# Patient Record
Sex: Female | Born: 1985 | Race: White | Hispanic: No | Marital: Married | Smoking: Never smoker
Health system: Southern US, Community
[De-identification: ages and names within clinical notes are randomized; demographics above are authoritative.]

## PROBLEM LIST (undated history)

## (undated) DIAGNOSIS — F32A Depression, unspecified: Secondary | ICD-10-CM

## (undated) DIAGNOSIS — N809 Endometriosis, unspecified: Secondary | ICD-10-CM

## (undated) DIAGNOSIS — F419 Anxiety disorder, unspecified: Secondary | ICD-10-CM

## (undated) DIAGNOSIS — R519 Headache, unspecified: Secondary | ICD-10-CM

## (undated) DIAGNOSIS — R51 Headache: Secondary | ICD-10-CM

## (undated) DIAGNOSIS — F329 Major depressive disorder, single episode, unspecified: Secondary | ICD-10-CM

## (undated) DIAGNOSIS — I2699 Other pulmonary embolism without acute cor pulmonale: Secondary | ICD-10-CM

## (undated) HISTORY — PX: DRUG INDUCED ENDOSCOPY: SHX6808

## (undated) HISTORY — DX: Endometriosis, unspecified: N80.9

## (undated) HISTORY — PX: OTHER SURGICAL HISTORY: SHX169

## (undated) HISTORY — PX: COLPOSCOPY: SHX161

---
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
## 2006-05-07 HISTORY — PX: TONSILLECTOMY: SUR1361

## 2015-06-23 LAB — OB RESULTS CONSOLE RUBELLA ANTIBODY, IGM: Rubella: IMMUNE

## 2015-06-23 LAB — OB RESULTS CONSOLE HIV ANTIBODY (ROUTINE TESTING): HIV: NONREACTIVE

## 2015-06-23 LAB — OB RESULTS CONSOLE RPR: RPR: NONREACTIVE

## 2015-06-23 LAB — OB RESULTS CONSOLE HEPATITIS B SURFACE ANTIGEN: Hepatitis B Surface Ag: NEGATIVE

## 2015-11-30 ENCOUNTER — Inpatient Hospital Stay (HOSPITAL_COMMUNITY): Payer: BLUE CROSS/BLUE SHIELD

## 2015-11-30 ENCOUNTER — Inpatient Hospital Stay (HOSPITAL_COMMUNITY)
Admission: AD | Admit: 2015-11-30 | Discharge: 2015-12-04 | DRG: 765 | Disposition: A | Discharge status: Home / Self Care | Payer: BLUE CROSS/BLUE SHIELD | Source: Ambulatory Visit | Attending: Obstetrics and Gynecology | Admitting: Obstetrics and Gynecology

## 2015-11-30 ENCOUNTER — Encounter: Payer: Self-pay | Admitting: Advanced Practice Midwife

## 2015-11-30 DIAGNOSIS — O4593 Premature separation of placenta, unspecified, third trimester: Secondary | ICD-10-CM | POA: Diagnosis present

## 2015-11-30 DIAGNOSIS — Z86711 Personal history of pulmonary embolism: Secondary | ICD-10-CM | POA: Diagnosis not present

## 2015-11-30 DIAGNOSIS — Z3A31 31 weeks gestation of pregnancy: Secondary | ICD-10-CM

## 2015-11-30 DIAGNOSIS — Z9889 Other specified postprocedural states: Secondary | ICD-10-CM

## 2015-11-30 DIAGNOSIS — O42013 Preterm premature rupture of membranes, onset of labor within 24 hours of rupture, third trimester: Secondary | ICD-10-CM | POA: Diagnosis present

## 2015-11-30 DIAGNOSIS — O4290 Premature rupture of membranes, unspecified as to length of time between rupture and onset of labor, unspecified weeks of gestation: Secondary | ICD-10-CM | POA: Diagnosis present

## 2015-11-30 HISTORY — DX: Other pulmonary embolism without acute cor pulmonale: I26.99

## 2015-11-30 LAB — CBC
HCT: 35.3 % — ABNORMAL LOW (ref 36.0–46.0)
Hemoglobin: 12.1 g/dL (ref 12.0–15.0)
MCH: 31.8 pg (ref 26.0–34.0)
MCHC: 34.3 g/dL (ref 30.0–36.0)
MCV: 92.9 fL (ref 78.0–100.0)
Platelets: 205 10*3/uL (ref 150–400)
RBC: 3.8 MIL/uL — ABNORMAL LOW (ref 3.87–5.11)
RDW: 13.2 % (ref 11.5–15.5)
WBC: 22.9 10*3/uL — ABNORMAL HIGH (ref 4.0–10.5)

## 2015-11-30 LAB — TYPE AND SCREEN
ABO/RH(D): B POS
Antibody Screen: NEGATIVE

## 2015-11-30 MED ORDER — DOCUSATE SODIUM 100 MG PO CAPS
100.0000 mg | ORAL_CAPSULE | Freq: Every day | ORAL | Status: DC
  Administered 2015-12-02 – 2015-12-04 (×2): 100 mg via ORAL
  Filled 2015-11-30 (×3): qty 1

## 2015-11-30 MED ORDER — ZOLPIDEM TARTRATE 5 MG PO TABS: 5.0000 mg | ORAL_TABLET | Freq: Every evening | ORAL | Status: DC | PRN

## 2015-11-30 MED ORDER — MAGNESIUM SULFATE 50 % IJ SOLN
2.0000 g/h | INTRAVENOUS | Status: DC
  Administered 2015-11-30: 2 g/h via INTRAVENOUS
  Filled 2015-11-30: qty 80

## 2015-11-30 MED ORDER — LACTATED RINGERS IV SOLN
INTRAVENOUS | Status: DC
  Administered 2015-11-30: 15:00:00 via INTRAVENOUS

## 2015-11-30 MED ORDER — MAGNESIUM SULFATE 50 % IJ SOLN
2.0000 g/h | INTRAMUSCULAR | Status: DC
  Administered 2015-12-01: 2 g/h via INTRAVENOUS
  Filled 2015-11-30 (×2): qty 80

## 2015-11-30 MED ORDER — LIDOCAINE HCL (PF) 1 % IJ SOLN: 30.0000 mL | INTRAMUSCULAR | Status: DC | PRN

## 2015-11-30 MED ORDER — SODIUM CHLORIDE 0.9 % IV SOLN
2.0000 g | Freq: Four times a day (QID) | INTRAVENOUS | Status: DC
  Administered 2015-11-30 – 2015-12-01 (×6): 2 g via INTRAVENOUS
  Filled 2015-11-30 (×8): qty 2000

## 2015-11-30 MED ORDER — SOD CITRATE-CITRIC ACID 500-334 MG/5ML PO SOLN
30.0000 mL | ORAL | Status: DC | PRN
  Administered 2015-12-01: 30 mL via ORAL
  Filled 2015-11-30: qty 15

## 2015-11-30 MED ORDER — ERYTHROMYCIN BASE 250 MG PO TABS
250.0000 mg | ORAL_TABLET | Freq: Four times a day (QID) | ORAL | Status: DC
  Filled 2015-11-30 (×2): qty 1

## 2015-11-30 MED ORDER — OXYCODONE-ACETAMINOPHEN 5-325 MG PO TABS
1.0000 | ORAL_TABLET | ORAL | Status: DC | PRN
Start: 2015-11-30 — End: 2015-12-01

## 2015-11-30 MED ORDER — LACTATED RINGERS IV SOLN: 500.0000 mL | INTRAVENOUS | Status: DC | PRN

## 2015-11-30 MED ORDER — SODIUM CHLORIDE 0.9 % IV SOLN
250.0000 mg | Freq: Four times a day (QID) | INTRAVENOUS | Status: DC
  Administered 2015-11-30 – 2015-12-01 (×5): 250 mg via INTRAVENOUS
  Filled 2015-11-30 (×9): qty 5

## 2015-11-30 MED ORDER — OXYTOCIN BOLUS FROM INFUSION: 500.0000 mL | Freq: Once | INTRAVENOUS | Status: DC

## 2015-11-30 MED ORDER — MAGNESIUM SULFATE BOLUS VIA INFUSION
4.0000 g | Freq: Once | INTRAVENOUS | Status: AC
  Administered 2015-11-30: 4 g via INTRAVENOUS
  Filled 2015-11-30: qty 500

## 2015-11-30 MED ORDER — INDOMETHACIN 50 MG PO CAPS: 100.0000 mg | ORAL_CAPSULE | Freq: Once | ORAL | Status: DC

## 2015-11-30 MED ORDER — BETAMETHASONE SOD PHOS & ACET 6 (3-3) MG/ML IJ SUSP
12.0000 mg | INTRAMUSCULAR | Status: AC
  Administered 2015-11-30 – 2015-12-01 (×2): 12 mg via INTRAMUSCULAR
  Filled 2015-11-30 (×2): qty 2

## 2015-11-30 MED ORDER — ACETAMINOPHEN 325 MG PO TABS: 650.0000 mg | ORAL_TABLET | ORAL | Status: DC | PRN

## 2015-11-30 MED ORDER — OXYCODONE-ACETAMINOPHEN 5-325 MG PO TABS: 2.0000 | ORAL_TABLET | ORAL | Status: DC | PRN

## 2015-11-30 MED ORDER — PRENATAL MULTIVITAMIN CH
1.0000 | ORAL_TABLET | Freq: Every day | ORAL | Status: DC
  Administered 2015-12-03: 1 via ORAL
  Filled 2015-11-30 (×3): qty 1

## 2015-11-30 MED ORDER — CALCIUM CARBONATE ANTACID 500 MG PO CHEW: 2.0000 | CHEWABLE_TABLET | ORAL | Status: DC | PRN

## 2015-11-30 MED ORDER — AMOXICILLIN 500 MG PO CAPS
500.0000 mg | ORAL_CAPSULE | Freq: Three times a day (TID) | ORAL | Status: DC
  Filled 2015-11-30 (×2): qty 1

## 2015-11-30 MED ORDER — ONDANSETRON HCL 4 MG/2ML IJ SOLN: 4.0000 mg | Freq: Four times a day (QID) | INTRAMUSCULAR | Status: DC | PRN

## 2015-11-30 MED ORDER — OXYTOCIN 40 UNITS IN LACTATED RINGERS INFUSION - SIMPLE MED: 2.5000 [IU]/h | INTRAVENOUS | Status: DC

## 2015-11-30 MED ORDER — INDOMETHACIN 25 MG PO CAPS: 25.0000 mg | ORAL_CAPSULE | Freq: Four times a day (QID) | ORAL | Status: DC

## 2015-11-30 MED ORDER — LACTATED RINGERS IV SOLN
INTRAVENOUS | Status: DC
  Administered 2015-12-01 (×2): via INTRAVENOUS

## 2015-11-30 NOTE — H&P (Signed)
30 y.o. [redacted]w[redacted]d  G1P0 comes in c/o ROM at 2am.  Otherwise has good fetal movement and no bleeding.  She did not report contractions.  History reviewed. No pertinent past medical history. History reviewed. No pertinent surgical history.  OB History  Gravida Para Term Preterm AB Living  1            SAB TAB Ectopic Multiple Live Births               # Outcome Date GA Lbr Len/2nd Weight Sex Delivery Anes PTL Lv  1 Current               Social History   Social History  . Marital status: Married    Spouse name: N/A  . Number of children: N/A  . Years of education: N/A   Occupational History  . Not on file.   Social History Main Topics  . Smoking status: Never Smoker  . Smokeless tobacco: Never Used  . Alcohol use Not on file  . Drug use: Unknown  . Sexual activity: Yes   Other Topics Concern  . Not on file   Social History Narrative  . No narrative on file   Naproxen    Prenatal Transfer Tool  Maternal Diabetes: No Genetic Screening: Declined Maternal Ultrasounds/Referrals: Normal Fetal Ultrasounds or other Referrals:  None Maternal Substance Abuse:  No Significant Maternal Medications:  None Significant Maternal Lab Results: None  Other PNC: uncomplicated.  H/o PE on OCPs 2006.  Started on prophylactic Lovenox early pregnancy.    Vitals:   11/30/15 2043 11/30/15 2100  BP:    Pulse:    Resp: (P) 18 18  Temp:  98.2 F (36.8 C)     Lungs/Cor:  NAD Abdomen:  soft, gravid Ex:  no cords, erythema SVE:  4/100/+1 at initial check, now 7-8cm FHTs:  140, good STV, NST R Toco:  q3-19   A/P   Admitted with PPROM at 31.2 Dr. Dareen Piano started pt on latency antibiotics and course of betamethasone. [place] cephalic. She became more uncomfortable in the morning and was found to be fully effaced and 4cm.  Pt reports anaphylactic symptoms to naproxen, so indomethicin was not started.  MgSO4 initiated for CP prophylaxis.  Lovenox held and SCDs placed. Later in the day pt was  feeling more pain and was rechecked and found to be 6cm.  She was transferred to L&D.  Mgmt above continues.  Discussed with RN limiting cervical checks as possible and being vigilant for signs of chorioamnionitis.  GBS unknown  Lakya Schrupp

## 2015-11-30 NOTE — MAU Note (Signed)
Pt presents complaining of SROM with copious amounts of clear fluid. Denies pain. Reports good fetal movement. Denies bleeding

## 2015-11-30 NOTE — Anesthesia Pain Management Evaluation Note (Signed)
  CRNA Pain Management Visit Note  Patient: Stephanie Hicks, 30 y.o., female  "Hello I am a member of the anesthesia team at [facility]. We have an anesthesia team available at all times to provide care throughout the hospital, including epidural management and anesthesia for C-section. I don't know your plan for the delivery whether it a natural birth, water birth, IV sedation, nitrous supplementation, doula or epidural, but we want to meet your pain goals."   1.Was your pain managed to your expectations on prior hospitalizations?   No prior hospitalizations  2.What is your expectation for pain management during this hospitalization?     IV pain meds and Nitrous Oxide  3.How can we help you reach that goal? Be available if needed.    Record the patient's initial score and the patient's pain goal.   Pain: 8  Pain Goal: 9 [facility] wants you to be able to say your pain was always managed very well.  [facility] 11/30/2015

## 2015-11-30 NOTE — MAU Provider Note (Signed)
  History     CSN: [id number]  Arrival date and time: 11/30/15 8921   First Provider Initiated Contact with Patient 11/30/15 [phone]      Chief Complaint  Patient presents with  . Rupture of Membranes   Stephanie Hicks is a 30 y.o. G1P0 at 31.2 weeks who presents today with ROM. She states that around 0200 she awoke and had a large gush of fluid. She denies any VB of contractions. She reports normal fetal movement. She denies any complications with the pregnancy to this point.    No past medical history on file.  No past surgical history on file.  No family history on file.  Social History  Substance Use Topics  . Smoking status: Not on file  . Smokeless tobacco: Not on file  . Alcohol use Not on file    Allergies: Allergies not on file  No prescriptions prior to admission.    Review of Systems  Constitutional: Negative for chills and fever.  Gastrointestinal: Positive for diarrhea ("off and on"). Negative for abdominal pain, constipation, nausea and vomiting.  Genitourinary: Negative for dysuria, frequency and urgency.   Physical Exam   There were no vitals taken for this visit.  Physical Exam  Nursing note and vitals reviewed. Constitutional: She is oriented to person, place, and time. She appears well-developed and well-nourished. No distress.  HENT:  Head: Normocephalic.  Cardiovascular: Normal rate.   Respiratory: Effort normal.  GI: Soft. There is no tenderness. There is no rebound.  Neurological: She is alert and oriented to person, place, and time.  Skin: Skin is warm and dry.  Psychiatric: She has a normal mood and affect.    MAU Course  Procedures  MDM 0435: D/W Dr. Dareen Piano, will admit to ante. ABX/BMZ and [place].   Assessment and Plan   1. PROM (premature rupture of membranes)    Admit to antenatal Consult neo [place]  BMZ and antibiotics   Tawnya Crook 11/30/2015, 4:21 AM

## 2015-12-01 ENCOUNTER — Inpatient Hospital Stay (HOSPITAL_COMMUNITY): Payer: BLUE CROSS/BLUE SHIELD | Admitting: Certified Registered Nurse Anesthetist

## 2015-12-01 ENCOUNTER — Encounter (HOSPITAL_COMMUNITY)
Admission: AD | Disposition: A | Discharge status: Home / Self Care | Payer: Self-pay | Source: Ambulatory Visit | Attending: Obstetrics and Gynecology

## 2015-12-01 ENCOUNTER — Encounter (HOSPITAL_COMMUNITY): Payer: Self-pay | Admitting: Anesthesiology

## 2015-12-01 DIAGNOSIS — Z9889 Other specified postprocedural states: Secondary | ICD-10-CM

## 2015-12-01 LAB — CBC
HEMATOCRIT: 28.7 % — AB (ref 36.0–46.0)
HEMOGLOBIN: 9.7 g/dL — AB (ref 12.0–15.0)
MCH: 31.6 pg (ref 26.0–34.0)
MCHC: 33.8 g/dL (ref 30.0–36.0)
MCV: 93.5 fL (ref 78.0–100.0)
Platelets: 183 10*3/uL (ref 150–400)
RBC: 3.07 MIL/uL — ABNORMAL LOW (ref 3.87–5.11)
RDW: 13.4 % (ref 11.5–15.5)
WBC: 25.5 10*3/uL — ABNORMAL HIGH (ref 4.0–10.5)

## 2015-12-01 LAB — CREATININE, SERUM
Creatinine, Ser: 0.43 mg/dL — ABNORMAL LOW (ref 0.44–1.00)
GFR calc Af Amer: >60 mL/min (ref >60)
GFR calc non Af Amer: >60 mL/min (ref >60)

## 2015-12-01 LAB — RPR: RPR: NONREACTIVE

## 2015-12-01 LAB — ABO/RH: ABO/RH(D): B POS

## 2015-12-01 SURGERY — Surgical Case
Anesthesia: Spinal

## 2015-12-01 MED ORDER — ONDANSETRON HCL 4 MG/2ML IJ SOLN
INTRAMUSCULAR | Status: DC | PRN
  Administered 2015-12-01: 4 mg via INTRAVENOUS

## 2015-12-01 MED ORDER — MEPERIDINE HCL 25 MG/ML IJ SOLN: 6.2500 mg | INTRAMUSCULAR | Status: DC | PRN

## 2015-12-01 MED ORDER — TETANUS-DIPHTH-ACELL PERTUSSIS 5-2.5-18.5 LF-MCG/0.5 IM SUSP: 0.5000 mL | Freq: Once | INTRAMUSCULAR | Status: DC

## 2015-12-01 MED ORDER — LACTATED RINGERS IV SOLN
INTRAVENOUS | Status: DC | PRN
  Administered 2015-12-01: 14:00:00 via INTRAVENOUS

## 2015-12-01 MED ORDER — CEFAZOLIN SODIUM-DEXTROSE 2-3 GM-% IV SOLR
INTRAVENOUS | Status: DC | PRN
  Administered 2015-12-01: 2 g via INTRAVENOUS

## 2015-12-01 MED ORDER — WITCH HAZEL-GLYCERIN EX PADS: 1.0000 "application " | MEDICATED_PAD | CUTANEOUS | Status: DC | PRN

## 2015-12-01 MED ORDER — DEXTROSE 5 % IV SOLN
1.0000 ug/kg/h | INTRAVENOUS | Status: DC | PRN
Start: 2015-12-01 — End: 2015-12-04
  Filled 2015-12-01: qty 2

## 2015-12-01 MED ORDER — DIPHENHYDRAMINE HCL 25 MG PO CAPS
25.0000 mg | ORAL_CAPSULE | ORAL | Status: DC | PRN
  Administered 2015-12-01 – 2015-12-02 (×2): 25 mg via ORAL
  Filled 2015-12-01 (×3): qty 1

## 2015-12-01 MED ORDER — NALBUPHINE HCL 10 MG/ML IJ SOLN
5.0000 mg | INTRAMUSCULAR | Status: DC | PRN
Start: 2015-12-01 — End: 2015-12-04
  Administered 2015-12-01 – 2015-12-02 (×2): 5 mg via INTRAVENOUS

## 2015-12-01 MED ORDER — NALBUPHINE HCL 10 MG/ML IJ SOLN: 5.0000 mg | Freq: Once | INTRAMUSCULAR | Status: DC | PRN

## 2015-12-01 MED ORDER — DEXAMETHASONE SODIUM PHOSPHATE 10 MG/ML IJ SOLN
INTRAMUSCULAR | Status: DC | PRN
  Administered 2015-12-01: 4 mg via INTRAVENOUS

## 2015-12-01 MED ORDER — LACTATED RINGERS IV SOLN
INTRAVENOUS | Status: DC
  Administered 2015-12-01: 18:00:00 via INTRAVENOUS

## 2015-12-01 MED ORDER — NALBUPHINE HCL 10 MG/ML IJ SOLN
INTRAMUSCULAR | Status: AC
  Filled 2015-12-01: qty 1

## 2015-12-01 MED ORDER — NALBUPHINE HCL 10 MG/ML IJ SOLN: 5.0000 mg | INTRAMUSCULAR | Status: DC | PRN

## 2015-12-01 MED ORDER — HYDROMORPHONE HCL 1 MG/ML IJ SOLN
0.2500 mg | INTRAMUSCULAR | Status: DC | PRN
  Administered 2015-12-01: 0.5 mg via INTRAVENOUS

## 2015-12-01 MED ORDER — METHYLERGONOVINE MALEATE 0.2 MG PO TABS: 0.2000 mg | ORAL_TABLET | ORAL | Status: DC | PRN

## 2015-12-01 MED ORDER — OXYTOCIN 40 UNITS IN LACTATED RINGERS INFUSION - SIMPLE MED
1.0000 m[IU]/min | INTRAVENOUS | Status: DC
  Administered 2015-12-01: 1 m[IU]/min via INTRAVENOUS
  Filled 2015-12-01: qty 1000

## 2015-12-01 MED ORDER — ONDANSETRON HCL 4 MG/2ML IJ SOLN
INTRAMUSCULAR | Status: AC
  Filled 2015-12-01: qty 2

## 2015-12-01 MED ORDER — PHENYLEPHRINE 8 MG IN D5W 100 ML (0.08MG/ML) PREMIX OPTIME
INJECTION | INTRAVENOUS | Status: DC | PRN
  Administered 2015-12-01: 60 ug/min via INTRAVENOUS

## 2015-12-01 MED ORDER — OXYCODONE HCL 5 MG PO TABS
10.0000 mg | ORAL_TABLET | ORAL | Status: DC | PRN
  Administered 2015-12-03 – 2015-12-04 (×6): 10 mg via ORAL
  Filled 2015-12-01 (×6): qty 2

## 2015-12-01 MED ORDER — MEASLES, MUMPS & RUBELLA VAC ~~LOC~~ INJ: 0.5000 mL | INJECTION | Freq: Once | SUBCUTANEOUS | Status: DC

## 2015-12-01 MED ORDER — ONDANSETRON HCL 4 MG/2ML IJ SOLN: 4.0000 mg | Freq: Three times a day (TID) | INTRAMUSCULAR | Status: DC | PRN

## 2015-12-01 MED ORDER — OXYCODONE HCL 5 MG PO TABS
5.0000 mg | ORAL_TABLET | ORAL | Status: DC | PRN
Start: 2015-12-01 — End: 2015-12-04
  Administered 2015-12-02 – 2015-12-03 (×2): 5 mg via ORAL
  Filled 2015-12-01 (×2): qty 1

## 2015-12-01 MED ORDER — NALOXONE HCL 0.4 MG/ML IJ SOLN: 0.4000 mg | INTRAMUSCULAR | Status: DC | PRN

## 2015-12-01 MED ORDER — MORPHINE SULFATE (PF) 0.5 MG/ML IJ SOLN
INTRAMUSCULAR | Status: DC | PRN
  Administered 2015-12-01: .2 mg via INTRATHECAL

## 2015-12-01 MED ORDER — FERROUS SULFATE 325 (65 FE) MG PO TABS
325.0000 mg | ORAL_TABLET | Freq: Two times a day (BID) | ORAL | Status: DC
  Administered 2015-12-02 – 2015-12-04 (×5): 325 mg via ORAL
  Filled 2015-12-01 (×5): qty 1

## 2015-12-01 MED ORDER — OXYTOCIN 40 UNITS IN LACTATED RINGERS INFUSION - SIMPLE MED: 1.0000 m[IU]/min | INTRAVENOUS | Status: DC

## 2015-12-01 MED ORDER — SIMETHICONE 80 MG PO CHEW
80.0000 mg | CHEWABLE_TABLET | ORAL | Status: DC
  Administered 2015-12-01 – 2015-12-03 (×3): 80 mg via ORAL
  Filled 2015-12-01 (×3): qty 1

## 2015-12-01 MED ORDER — DIPHENHYDRAMINE HCL 50 MG/ML IJ SOLN
12.5000 mg | INTRAMUSCULAR | Status: DC | PRN
  Administered 2015-12-01: 12.5 mg via INTRAVENOUS
  Filled 2015-12-01: qty 1

## 2015-12-01 MED ORDER — DIPHENHYDRAMINE HCL 25 MG PO CAPS
25.0000 mg | ORAL_CAPSULE | Freq: Four times a day (QID) | ORAL | Status: DC | PRN
  Filled 2015-12-01: qty 1

## 2015-12-01 MED ORDER — PROMETHAZINE HCL 25 MG/ML IJ SOLN: 6.2500 mg | INTRAMUSCULAR | Status: DC | PRN

## 2015-12-01 MED ORDER — FLEET ENEMA 7-19 GM/118ML RE ENEM: 1.0000 | ENEMA | Freq: Every day | RECTAL | Status: DC | PRN

## 2015-12-01 MED ORDER — ACETAMINOPHEN 500 MG PO TABS
1000.0000 mg | ORAL_TABLET | Freq: Four times a day (QID) | ORAL | Status: AC
  Administered 2015-12-01 – 2015-12-02 (×4): 1000 mg via ORAL
  Filled 2015-12-01 (×4): qty 2

## 2015-12-01 MED ORDER — PHENYLEPHRINE 8 MG IN D5W 100 ML (0.08MG/ML) PREMIX OPTIME
INJECTION | INTRAVENOUS | Status: AC
  Filled 2015-12-01: qty 100

## 2015-12-01 MED ORDER — HYDROMORPHONE HCL 1 MG/ML IJ SOLN
INTRAMUSCULAR | Status: AC
  Filled 2015-12-01: qty 1

## 2015-12-01 MED ORDER — PRENATAL MULTIVITAMIN CH
1.0000 | ORAL_TABLET | Freq: Every day | ORAL | Status: DC
  Administered 2015-12-02 – 2015-12-03 (×2): 1 via ORAL
  Filled 2015-12-01: qty 1

## 2015-12-01 MED ORDER — OXYTOCIN 40 UNITS IN LACTATED RINGERS INFUSION - SIMPLE MED: 2.5000 [IU]/h | INTRAVENOUS | Status: AC

## 2015-12-01 MED ORDER — PHENYLEPHRINE HCL 10 MG/ML IJ SOLN
INTRAMUSCULAR | Status: DC | PRN
  Administered 2015-12-01: 40 ug via INTRAVENOUS

## 2015-12-01 MED ORDER — MORPHINE SULFATE-NACL 0.5-0.9 MG/ML-% IV SOSY
PREFILLED_SYRINGE | INTRAVENOUS | Status: AC
  Filled 2015-12-01: qty 1

## 2015-12-01 MED ORDER — ZOLPIDEM TARTRATE 5 MG PO TABS: 5.0000 mg | ORAL_TABLET | Freq: Every evening | ORAL | Status: DC | PRN

## 2015-12-01 MED ORDER — BUPIVACAINE IN DEXTROSE 0.75-8.25 % IT SOLN
INTRATHECAL | Status: DC | PRN
  Administered 2015-12-01: 1.6 mL via INTRATHECAL

## 2015-12-01 MED ORDER — OXYTOCIN 10 UNIT/ML IJ SOLN
INTRAVENOUS | Status: DC | PRN
  Administered 2015-12-01: 40 [IU] via INTRAVENOUS

## 2015-12-01 MED ORDER — FENTANYL CITRATE (PF) 100 MCG/2ML IJ SOLN
INTRAMUSCULAR | Status: AC
  Filled 2015-12-01: qty 2

## 2015-12-01 MED ORDER — SENNOSIDES-DOCUSATE SODIUM 8.6-50 MG PO TABS
2.0000 | ORAL_TABLET | ORAL | Status: DC
  Administered 2015-12-01 – 2015-12-03 (×3): 2 via ORAL
  Filled 2015-12-01 (×3): qty 2

## 2015-12-01 MED ORDER — DIBUCAINE 1 % RE OINT: 1.0000 "application " | TOPICAL_OINTMENT | RECTAL | Status: DC | PRN

## 2015-12-01 MED ORDER — TERBUTALINE SULFATE 1 MG/ML IJ SOLN
0.2500 mg | Freq: Once | INTRAMUSCULAR | Status: DC | PRN
Start: 2015-12-01 — End: 2015-12-01

## 2015-12-01 MED ORDER — SODIUM CHLORIDE 0.9% FLUSH: 3.0000 mL | INTRAVENOUS | Status: DC | PRN

## 2015-12-01 MED ORDER — ACETAMINOPHEN 325 MG PO TABS: 650.0000 mg | ORAL_TABLET | ORAL | Status: DC | PRN

## 2015-12-01 MED ORDER — MENTHOL 3 MG MT LOZG: 1.0000 | LOZENGE | OROMUCOSAL | Status: DC | PRN

## 2015-12-01 MED ORDER — NALBUPHINE HCL 10 MG/ML IJ SOLN
5.0000 mg | Freq: Once | INTRAMUSCULAR | Status: DC | PRN
  Filled 2015-12-01: qty 1

## 2015-12-01 MED ORDER — SIMETHICONE 80 MG PO CHEW
80.0000 mg | CHEWABLE_TABLET | Freq: Three times a day (TID) | ORAL | Status: DC
  Administered 2015-12-01 – 2015-12-04 (×7): 80 mg via ORAL
  Filled 2015-12-01 (×8): qty 1

## 2015-12-01 MED ORDER — SIMETHICONE 80 MG PO CHEW: 80.0000 mg | CHEWABLE_TABLET | ORAL | Status: DC | PRN

## 2015-12-01 MED ORDER — METHYLERGONOVINE MALEATE 0.2 MG/ML IJ SOLN: 0.2000 mg | INTRAMUSCULAR | Status: DC | PRN

## 2015-12-01 MED ORDER — SODIUM CHLORIDE 0.9 % IR SOLN
Status: DC | PRN
  Administered 2015-12-01: 1000 mL

## 2015-12-01 MED ORDER — BISACODYL 10 MG RE SUPP: 10.0000 mg | Freq: Every day | RECTAL | Status: DC | PRN

## 2015-12-01 MED ORDER — OXYTOCIN 10 UNIT/ML IJ SOLN
INTRAMUSCULAR | Status: AC
  Filled 2015-12-01: qty 4

## 2015-12-01 MED ORDER — FENTANYL CITRATE (PF) 100 MCG/2ML IJ SOLN
INTRAMUSCULAR | Status: DC | PRN
  Administered 2015-12-01: 10 ug via INTRATHECAL

## 2015-12-01 MED ORDER — ENOXAPARIN SODIUM 40 MG/0.4ML ~~LOC~~ SOLN
40.0000 mg | SUBCUTANEOUS | Status: DC
  Administered 2015-12-02 – 2015-12-03 (×2): 40 mg via SUBCUTANEOUS
  Filled 2015-12-01 (×4): qty 0.4

## 2015-12-01 MED ORDER — SCOPOLAMINE 1 MG/3DAYS TD PT72: 1.0000 | MEDICATED_PATCH | Freq: Once | TRANSDERMAL | Status: DC

## 2015-12-01 MED ORDER — COCONUT OIL OIL
1.0000 "application " | TOPICAL_OIL | Status: DC | PRN
  Administered 2015-12-03: 1 via TOPICAL
  Filled 2015-12-01: qty 120

## 2015-12-01 MED ORDER — DEXAMETHASONE SODIUM PHOSPHATE 10 MG/ML IJ SOLN
INTRAMUSCULAR | Status: AC
Start: 2015-12-01 — End: 2015-12-01
  Filled 2015-12-01: qty 1

## 2015-12-01 SURGICAL SUPPLY — 34 items
APL SKNCLS STERI-STRIP NONHPOA (GAUZE/BANDAGES/DRESSINGS) ×1
BENZOIN TINCTURE PRP APPL 2/3 (GAUZE/BANDAGES/DRESSINGS) ×3 IMPLANT
CHLORAPREP W/TINT 26ML (MISCELLANEOUS) ×3 IMPLANT
CLAMP CORD UMBIL (MISCELLANEOUS) IMPLANT
CLOSURE STERI STRIP 1/2 X4 (GAUZE/BANDAGES/DRESSINGS) ×2 IMPLANT
CLOSURE WOUND 1/2 X4 (GAUZE/BANDAGES/DRESSINGS) ×2
CLOTH BEACON ORANGE TIMEOUT ST (SAFETY) ×3 IMPLANT
DRSG OPSITE POSTOP 4X10 (GAUZE/BANDAGES/DRESSINGS) ×3 IMPLANT
ELECT REM PT RETURN 9FT ADLT (ELECTROSURGICAL) ×3
ELECTRODE REM PT RTRN 9FT ADLT (ELECTROSURGICAL) ×1 IMPLANT
EXTRACTOR VACUUM BELL STYLE (SUCTIONS) IMPLANT
GLOVE BIO SURGEON STRL SZ7 (GLOVE) ×3 IMPLANT
GLOVE BIOGEL PI IND STRL 7.0 (GLOVE) ×1 IMPLANT
GLOVE BIOGEL PI INDICATOR 7.0 (GLOVE) ×2
GOWN STRL REUS W/TWL LRG LVL3 (GOWN DISPOSABLE) ×6 IMPLANT
KIT ABG SYR 3ML LUER SLIP (SYRINGE) IMPLANT
NDL HYPO 25X5/8 SAFETYGLIDE (NEEDLE) IMPLANT
NEEDLE HYPO 25X5/8 SAFETYGLIDE (NEEDLE) IMPLANT
NS IRRIG 1000ML POUR BTL (IV SOLUTION) ×3 IMPLANT
PACK C SECTION WH (CUSTOM PROCEDURE TRAY) ×3 IMPLANT
PAD OB MATERNITY 4.3X12.25 (PERSONAL CARE ITEMS) ×3 IMPLANT
PENCIL SMOKE EVAC W/HOLSTER (ELECTROSURGICAL) ×3 IMPLANT
RTRCTR C-SECT PINK 25CM LRG (MISCELLANEOUS) ×3 IMPLANT
STRIP CLOSURE SKIN 1/2X4 (GAUZE/BANDAGES/DRESSINGS) ×3 IMPLANT
SUT MNCRL 0 VIOLET CTX 36 (SUTURE) ×2 IMPLANT
SUT MONOCRYL 0 CTX 36 (SUTURE) ×8
SUT PLAIN 2 0 XLH (SUTURE) ×2 IMPLANT
SUT VIC AB 0 CT1 27 (SUTURE) ×6
SUT VIC AB 0 CT1 27XBRD ANBCTR (SUTURE) ×2 IMPLANT
SUT VIC AB 2-0 CT1 27 (SUTURE) ×3
SUT VIC AB 2-0 CT1 TAPERPNT 27 (SUTURE) ×1 IMPLANT
SUT VIC AB 4-0 KS 27 (SUTURE) ×3 IMPLANT
TOWEL OR 17X24 6PK STRL BLUE (TOWEL DISPOSABLE) ×3 IMPLANT
TRAY FOLEY CATH SILVER 14FR (SET/KITS/TRAYS/PACK) ×3 IMPLANT

## 2015-12-01 NOTE — Anesthesia Postprocedure Evaluation (Signed)
Anesthesia Post Note  Patient: Stephanie Hicks  Procedure(s) Performed: Procedure(s) (LRB): CESAREAN SECTION (N/A)  Patient location during evaluation: PACU Anesthesia Type: Spinal Level of consciousness: awake Pain management: pain level controlled Vital Signs Assessment: post-procedure vital signs reviewed and stable Respiratory status: spontaneous breathing Cardiovascular status: stable Postop Assessment: no headache, no backache, spinal receding, patient able to bend at knees and no signs of nausea or vomiting Anesthetic complications: no     Last Vitals:  Vitals:   12/01/15 1515 12/01/15 1530  BP: 90/62 (!) 100/59  Pulse: 75 74  Resp: 13 15  Temp:      Last Pain:  Vitals:   12/01/15 1530  TempSrc:   PainSc: 0-No pain   Pain Goal: Patients Stated Pain Goal: 8 (11/30/15 1438)               Neveen Daponte JR,JOHN [name]

## 2015-12-01 NOTE — Brief Op Note (Signed)
11/30/2015 - 12/01/2015  3:01 PM  PATIENT:  Stephanie Hicks  30 y.o. female  PRE-OPERATIVE DIAGNOSIS:  Primary C-section for possible abruption  POST-OPERATIVE DIAGNOSIS:  Primary c-section for abruption  PROCEDURE:  Procedure(s): CESAREAN SECTION (N/Hicks)  SURGEON:  Surgeon(s) and Role:    * Carrington Clamp, MD - Primary  ANESTHESIA:   spinal  EBL:  Total I/O In: 3470 [P.O.:480; I.V.:2990] Out: 2900 [Urine:2200; Blood:700]  SPECIMEN:  Source of Specimen:  placenta  DISPOSITION OF SPECIMEN:  PATHOLOGY  COUNTS:  YES  TOURNIQUET:  * No tourniquets in log *  DICTATION: .Dragon Dictation  PLAN OF CARE: Admit to inpatient   PATIENT DISPOSITION:  PACU - hemodynamically stable.   Delay start of Pharmacological VTE agent (>24hrs) due to surgical blood loss or risk of bleeding: not applicable  Complications:  None. Medications:  Ancef, Pitocin Findings:  Baby female, Apgars P (baby was moving actively after birth and breathing on own), weight P.   Normal tubes, ovaries and uterus seen.  Baby was transferred to NICU after birth.  Technique:  After adequate spinal anesthesia was achieved, the patient was prepped and draped in usual sterile fashion.  Hicks foley catheter was used to drain the bladder.  Hicks pfannanstiel incision was made with the scalpel and carried down to the fascia with the bovie cautery. The fascia was incised in the midline with the scalpel and carried in Hicks transverse curvilinear manner bilaterally.  The fascia was reflected superiorly and inferiorly off the rectus muscles and the muscles split in the midline.  Hicks bowel free portion of the peritoneum was entered bluntly and then extended in Hicks superior and inferior manner with good visualization of the bowel and bladder.  The Alexis instrument was then placed and the vesico-uterine fascia tented up and incised in Hicks transverse curvilinear manner.  Hicks 2 cm transverse incision was made in the upper portion of the lower uterine  segment until the amnion was exposed.   The incision was extended transversely in Hicks blunt manner.  Bloody fluid and clot behind the anterior placenta was noted and the baby delivered in the vertex presentation without complication.  The baby was bulb suctioned and the cord was clamped and cut.  The baby was then handed to awaiting Neonatology.  The placenta was then delivered manually and the uterus cleared of all debris.  The uterine incision was then closed with Hicks running lock stitch of 0 monocryl.  An imbricating layer of 0 monocryl was closed as well. Excellent hemostasis of the uterine incision was achieved and the abdomen was cleared with irrigation.  The peritoneum was closed with Hicks running stitch of 2-0 vicryl.  This incorporated the rectus muscles as Hicks separate layer.  The fascia was then closed with Hicks running stitch of 0 vicryl.  The subcutaneous layer was closed with interrupted  stitches of 2-0 plain gut.  The skin was closed with 4-0 vicryl on Hicks Keith needle and steri-strips.  The patient tolerated the procedure well and was returned to the recovery room in stable condition.  All counts were correct times three.  Stephanie Hicks

## 2015-12-01 NOTE — Consult Note (Signed)
[facility]  Delivery Note:  C-section       12/01/2015  3:27 PM  I was called to the operating room at the request of the patient's obstetrician (Dr. Horvath) for a primary c-section for abruption.  PRENATAL HX:  This is a 30 y/o G1P0 at 31 and 3/[redacted] weeks gestation who was admitted yesterday for PPROM (clear fluid, ROM 36 hours).  Her pregnancy was also complicated by history PE in 2006 so she was started on lovenox prophylasix early in the pregnancy.  She received BMZ on 7/26 and 7/27 and was also started on latency antibiotics.  Labor stalled at 8 cm and then she began to have increased bleeding so was taken for urgent c-section.    DELIVERY:  Infant was vigorous at delivery, initially requiring no resuscitation other than standard warming, drying and stimulation.  However, infant had moderate retractions with O2 saturations in high 70s, so CPAP applied.  Max FiO2 40%, but weaned to 21% at the time of admission to NICU.  APGARs 7 and 8.  _____________________ Electronically Signed By: Reeva Davern, MD Neonatologist  

## 2015-12-01 NOTE — Op Note (Signed)
11/30/2015 - 12/01/2015  3:01 PM  PATIENT:  Stephanie Hicks  30 y.o. female  PRE-OPERATIVE DIAGNOSIS:  Primary C-section for possible abruption  POST-OPERATIVE DIAGNOSIS:  Primary c-section for abruption  PROCEDURE:  Procedure(s): CESAREAN SECTION (N/A)  SURGEON:  Surgeon(s) and Role:    * Carrington Clamp, MD - Primary  ANESTHESIA:   spinal  EBL:  Total I/O In: 3470 [P.O.:480; I.V.:2990] Out: 2900 [Urine:2200; Blood:700]  SPECIMEN:  Source of Specimen:  placenta  DISPOSITION OF SPECIMEN:  PATHOLOGY  COUNTS:  YES  TOURNIQUET:  * No tourniquets in log *  DICTATION: .Dragon Dictation  PLAN OF CARE: Admit to inpatient   PATIENT DISPOSITION:  PACU - hemodynamically stable.   Delay start of Pharmacological VTE agent (>24hrs) due to surgical blood loss or risk of bleeding: not applicable  Complications:  None. Medications:  Ancef, Pitocin Findings:  Baby female, Apgars P (baby was moving actively after birth and breathing on own), weight P.   Clot and blood behind placenta.  Normal tubes, ovaries and uterus seen.  Baby was transferred to NICU after birth.  Technique:  After adequate spinal anesthesia was achieved, the patient was prepped and draped in usual sterile fashion.  A foley catheter was used to drain the bladder.  A pfannanstiel incision was made with the scalpel and carried down to the fascia with the bovie cautery. The fascia was incised in the midline with the scalpel and carried in a transverse curvilinear manner bilaterally.  The fascia was reflected superiorly and inferiorly off the rectus muscles and the muscles split in the midline.  A bowel free portion of the peritoneum was entered bluntly and then extended in a superior and inferior manner with good visualization of the bowel and bladder.  The Alexis instrument was then placed and the vesico-uterine fascia tented up and incised in a transverse curvilinear manner.  A 2 cm transverse incision was made in the upper  portion of the lower uterine segment until the amnion was exposed.   The incision was extended transversely in a blunt manner.  Bloody fluid and clot behind the anterior placenta was noted and the baby delivered in the vertex presentation without complication.  The baby was bulb suctioned and the cord was clamped and cut.  The baby was then handed to awaiting Neonatology.  The placenta was then delivered manually and the uterus cleared of all debris.  The uterine incision was then closed with a running lock stitch of 0 monocryl.  An imbricating layer of 0 monocryl was closed as well. Excellent hemostasis of the uterine incision was achieved and the abdomen was cleared with irrigation.  The peritoneum was closed with a running stitch of 2-0 vicryl.  This incorporated the rectus muscles as a separate layer.  The fascia was then closed with a running stitch of 0 vicryl.  The subcutaneous layer was closed with interrupted  stitches of 2-0 plain gut.  The skin was closed with 4-0 vicryl on a Keith needle and steri-strips.  The patient tolerated the procedure well and was returned to the recovery room in stable condition.  All counts were correct times three.  Stephanie Hicks A

## 2015-12-01 NOTE — Anesthesia Preprocedure Evaluation (Signed)
Anesthesia Evaluation  Patient identified by MRN, date of birth, ID band Patient awake    Reviewed: Allergy & Precautions, H&P , NPO status , Patient's Chart, lab work & pertinent test results  Airway Mallampati: I  TM Distance: >3 FB Neck ROM: full    Dental no notable dental hx.    Pulmonary neg pulmonary ROS,    Pulmonary exam normal        Cardiovascular negative cardio ROS Normal cardiovascular exam     Neuro/Psych negative neurological ROS  negative psych ROS   GI/Hepatic negative GI ROS, Neg liver ROS,   Endo/Other  negative endocrine ROS  Renal/GU negative Renal ROS     Musculoskeletal   Abdominal Normal abdominal exam  (+)   Peds  Hematology negative hematology ROS (+)   Anesthesia Other Findings   Reproductive/Obstetrics (+) Pregnancy                            Anesthesia Physical Anesthesia Plan  ASA: II and emergent  Anesthesia Plan: Spinal   Post-op Pain Management:    Induction:   Airway Management Planned:   Additional Equipment:   Intra-op Plan:   Post-operative Plan:   Informed Consent: I have reviewed the patients History and Physical, chart, labs and discussed the procedure including the risks, benefits and alternatives for the proposed anesthesia with the patient or authorized representative who has indicated his/her understanding and acceptance.     Plan Discussed with: CRNA and Surgeon  Anesthesia Plan Comments:        Anesthesia Quick Evaluation  

## 2015-12-01 NOTE — Progress Notes (Signed)
30 y.o. G1P0 105w3d HD#1 admitted for CTX.  Contractions have slowed but pt still in pain.  Good FM.  Vitals:   12/01/15 0500 12/01/15 0605 12/01/15 0649 12/01/15 0745  BP:   105/69 102/68  Pulse:   (!) 109 (!) 106  Resp: 18 18  18   Temp: 98.6 F (37 C)   98.1 F (36.7 C)  TempSrc:    Oral  SpO2:      Weight:      Height:       Abd  Soft, gravid, nontender Ex SCDs FHTs  130s, good short term variability, NST R; occ mild to moderate variables Toco  May be q 10  Results for orders placed or performed during the hospital encounter of 11/30/15 (from the past 24 hour(s))  CBC     Status: Abnormal   Collection Time: 11/30/15 10:21 AM  Result Value Ref Range   WBC 22.9 (H) 4.0 - 10.5 K/uL   RBC 3.80 (L) 3.87 - 5.11 MIL/uL   Hemoglobin 12.1 12.0 - 15.0 g/dL   HCT 84.5 (L) 36.4 - 68.0 %   MCV 92.9 78.0 - 100.0 fL   MCH 31.8 26.0 - 34.0 pg   MCHC 34.3 30.0 - 36.0 g/dL   RDW 32.1 22.4 - 82.5 %   Platelets 205 150 - 400 K/uL    A:  HD#1  [redacted]w[redacted]d with PPROM, active labor and still 8 cm.  P: Will start pitocin to expedite delivery.  S/p BMZ.  On antibiotics.  Stephanie Hicks A

## 2015-12-01 NOTE — Transfer of Care (Signed)
Immediate Anesthesia Transfer of Care Note  Patient: Stephanie Hicks  Procedure(s) Performed: Procedure(s): CESAREAN SECTION (N/A)  Patient Location: PACU  Anesthesia Type:Spinal  Level of Consciousness: awake, alert  and oriented  Airway & Oxygen Therapy: Patient Spontanous Breathing  Post-op Assessment: Report given to RN and Post -op Vital signs reviewed and stable  Post vital signs: Reviewed and stable  Last Vitals:  Vitals:   12/01/15 1302 12/01/15 1307  BP:  98/61  Pulse:  93  Resp: 18   Temp:      Last Pain:  Vitals:   12/01/15 1302  TempSrc:   PainSc: 6       Patients Stated Pain Goal: 8 (11/30/15 1438)  Complications: No apparent anesthesia complications

## 2015-12-01 NOTE — Transfer of Care (Signed)
Immediate Anesthesia Transfer of Care Note  Patient: Stephanie Hicks  Procedure(s) Performed: Procedure(s): CESAREAN SECTION (N/A)  Patient Location: PACU  Anesthesia Type:Spinal  Level of Consciousness: awake, alert  and oriented  Airway & Oxygen Therapy: Patient Spontanous Breathing  Post-op Assessment: Report given to RN and Post -op Vital signs reviewed and stable  Post vital signs: Reviewed and stable  Last Vitals:  Vitals:   12/01/15 1302 12/01/15 1307  BP:  98/61  Pulse:  93  Resp: 18   Temp:      Last Pain:  Vitals:   12/01/15 1302  TempSrc:   PainSc: 6       Patients Stated Pain Goal: 8 (11/30/15 1438)  Complications: No apparent anesthesia complications 

## 2015-12-01 NOTE — Anesthesia Procedure Notes (Signed)
Spinal  Patient location during procedure: OR Start time: 12/01/2015 1:47 PM End time: 12/01/2015 1:49 PM Staffing Anesthesiologist: Leilani Able Performed: anesthesiologist  Preanesthetic Checklist Completed: patient identified, surgical consent, pre-op evaluation, timeout performed, IV checked, risks and benefits discussed and monitors and equipment checked Spinal Block Patient position: sitting Prep: site prepped and draped and DuraPrep Patient monitoring: heart rate, cardiac monitor, continuous pulse ox and blood pressure Approach: midline Location: L3-4 Injection technique: single-shot Needle Needle type: Sprotte  Needle gauge: 24 G Needle length: 9 cm Needle insertion depth: 5 cm Assessment Sensory level: T4

## 2015-12-02 LAB — CBC
HCT: 25.1 % — ABNORMAL LOW (ref 36.0–46.0)
Hemoglobin: 8.4 g/dL — ABNORMAL LOW (ref 12.0–15.0)
MCH: 31.5 pg (ref 26.0–34.0)
MCHC: 33.5 g/dL (ref 30.0–36.0)
MCV: 94 fL (ref 78.0–100.0)
PLATELETS: 168 10*3/uL (ref 150–400)
RBC: 2.67 MIL/uL — AB (ref 3.87–5.11)
RDW: 13.5 % (ref 11.5–15.5)
WBC: 21.8 10*3/uL — ABNORMAL HIGH (ref 4.0–10.5)

## 2015-12-02 MED ORDER — HYDROXYZINE HCL 25 MG PO TABS
25.0000 mg | ORAL_TABLET | Freq: Three times a day (TID) | ORAL | Status: DC | PRN
  Administered 2015-12-02 – 2015-12-03 (×3): 25 mg via ORAL
  Filled 2015-12-02 (×4): qty 1

## 2015-12-02 MED ORDER — HYDROXYZINE HCL 50 MG/ML IM SOLN
100.0000 mg | Freq: Four times a day (QID) | INTRAMUSCULAR | Status: DC | PRN
  Administered 2015-12-02: 100 mg via INTRAMUSCULAR
  Filled 2015-12-02 (×2): qty 2

## 2015-12-02 NOTE — Progress Notes (Signed)
CSW attempted to met with MOB at this time. However, MOB was visiting infant in NICU when [place] arrived. CSW will attempt to visit with MOB at a later time.

## 2015-12-02 NOTE — Lactation Note (Signed)
This note was copied from a baby's chart. Lactation Consultation Note  Patient Name: Stephanie Hicks [id number] Date: 12/02/2015 Reason for consult: Initial assessment;NICU baby Initial visit made.  Breastfeeding consultation services/support and Providing Breastmilk For Your Baby in NICU booklet given to mom.  This is her first baby.  She has pumped three times and obtained a few drops.  Reviewed pump using initiation setting, cleaning and EBM storage.  21 mm flanges given for best fit.  Mom has an [employer] Pure[employer] Yours DEBP for home use.  Instructed to pump 8 times in 24 hours.  Encouraged to call for concerns/assist prn.  Maternal Data Has patient been taught Hand Expression?: Yes Does the patient have breastfeeding experience prior to this delivery?: No  Feeding    LATCH Score/Interventions                      Lactation Tools Discussed/Used Pump Review: Setup, frequency, and cleaning;Milk Storage Initiated by:: RN Date initiated:: 12/02/15   Consult Status Consult Status: Follow-up Date: 12/03/15 Follow-up type: In-patient    Huston Foley 12/02/2015, 12:05 PM

## 2015-12-02 NOTE — Progress Notes (Signed)
Subjective: Postpartum Day 1: Cesarean Delivery Patient reports incisional pain, tolerating PO, + flatus, + BM and no problems voiding.    Objective: Vital signs in last 24 hours: Temp:  [97.6 F (36.4 C)-98.7 F (37.1 C)] 98 F (36.7 C) (07/28 0640) Pulse Rate:  [57-109] 73 (07/28 0640) Resp:  [13-18] 16 (07/28 0640) BP: (85-116)/(53-83) 94/54 (07/28 0640) SpO2:  [97 %-100 %] 99 % (07/28 0640) Weight:  [79.4 kg (175 lb)] 79.4 kg (175 lb) (07/27 1620)  Physical Exam:  General: alert, cooperative and no distress Lochia: appropriate Uterine Fundus: firm Incision: healing well, no significant drainage, no dehiscence DVT Evaluation: No evidence of DVT seen on physical exam. Negative Homan's sign. No cords or calf tenderness.   Recent Labs  12/01/15 1658 12/02/15 0611  HGB 9.7* 8.4*  HCT 28.7* 25.1*    Assessment/Plan: Status post Cesarean section. Doing well postoperatively.  Continue current care.  [name] [name] 12/02/2015, 9:29 AM

## 2015-12-03 LAB — TROPONIN I: TROPONIN I: 0.03 ng/mL — AB (ref <0.03)

## 2015-12-03 LAB — HEMOGLOBIN AND HEMATOCRIT, BLOOD
HCT: 30.5 % — ABNORMAL LOW (ref 36.0–46.0)
Hemoglobin: 10.1 g/dL — ABNORMAL LOW (ref 12.0–15.0)

## 2015-12-03 MED ORDER — GI COCKTAIL ~~LOC~~
30.0000 mL | Freq: Once | ORAL | Status: AC
  Administered 2015-12-03: 30 mL via ORAL
  Filled 2015-12-03: qty 30

## 2015-12-03 NOTE — Clinical Social Work Maternal (Signed)
  CLINICAL SOCIAL WORK MATERNAL/CHILD NOTE  Patient Details  Name: Stephanie Hicks MRN: [id number] Date of Birth: [birth year]  Date:  12/03/2015  Clinical Social Worker Initiating Note:  Stephanie Hicks Date/ Time Initiated:  12/03/15/1329     Child's Name:  Stephanie Hicks   Legal Guardian:  Father   Need for Interpreter:  None   Date of Referral:  12/03/15     Reason for Referral:  Other (Comment) (NICU Mom)   Referral Source:  NICU   Address:  [address]. [place] [address]  Phone number:  [phone]   Household Members:  Self, Spouse   Natural Supports (not living in the home):  Extended Family, Immediate Family, Parent   Professional Supports: None   Employment: Full-time   Type of Work:     Education:  Diplomatic Services operational officer Resources:  Multimedia programmer   Other Resources:      Cultural/Religious Considerations Which May Impact Care:  None Reported  Strengths:  Ability to meet basic needs , Home prepared for child    Risk Factors/Current Problems:  None   Cognitive State:  Alert , Linear Thinking , Insightful , Goal Oriented    Mood/Affect:  Interested , Bright , Happy    CSW Assessment: CSW met MOB to complete an assessment.  MOB was inviting, polite and receptive to meeting with CSW.  MOB informed CSW that her room quest were her husband/FOB Stephanie Hicks), and her mother.  MOB gave CSW permission to meet with MOB while MOB's room guest were present. CSW inquired about infant's health, and congratulated both parents.  Both parent's spoke with joy and excitement when referencing their son in the NICU.  CSW inquired about barriers to visiting infant after MOB is discharged, and MOB and FOB denied any barriers.  FOB did request a note for working informing his employer, of FOB's on admission to the NICU. CSW assured family that CSW would provide a letter to FOB. Both parents asked appropriate questions and appear knowledgeable as new parents.  CSW  inquired about past MH dx for MOB.  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 MOB denied being treated with medication and reported that MOB attended counseling.  CSW educated MOB about PPD. CSW reviewed signs and symptoms for PPD and encouraged MOB to ask questions. CSW informed MOB of possible supports and interventions to decrease PPD.  CSW also encouraged MOB to seek medical attention if needed for increased signs and symptoms for PPD.   CSW also educated MOB about SIDS and Safe Sleep.  MOB appeared knowledgeable as evidence by the responses to CSW questions.  MOB reported minimize items for infant due to infant being born 13 weeks early.  MOB stated she plans to have baby shower, and prior to infant discharge from NICU, parents will have everything that the infant needs.  CSW thanked MOB and FOB for meeting with CSW.  Parents had no additional questions or concerns.  CSW will continue to follow-up with parents while infant is in NICU.   CSW provided FOB with a letter for his employer.   CSW Plan/Description:  Information/Referral to [employer] , Psychosocial Support and Ongoing Assessment of Needs, Patient/Family Education , No Further Intervention Required/No Barriers to Discharge    Stephanie Nanas, LCSW 12/03/2015, 1:55 PM   Stephanie Hicks, MSW, LCSW Clinical Social Work [phone]

## 2015-12-03 NOTE — Lactation Note (Signed)
This note was copied from a baby's chart. Lactation Consultation Note: Mom just back from NICU getting ready to pump. Reports breasts are feeling heavier today. Reports she pumped 6 times yesterday and did sleep during the night. This is second pumping for today. Encouraged to pump 8 times/24 hours. Reviewed pumping rooms in NICU and can bring pump pieces with her when she visits baby. Has Ameda pump for home. No further questions at present. To call prn  Patient Name: [name] Date: 12/03/2015 Reason for consult: Follow-up assessment;NICU baby   Maternal Data Formula Feeding for Exclusion: No Has patient been taught Hand Expression?: Yes Does the patient have breastfeeding experience prior to this delivery?: No  Feeding    LATCH Score/Interventions                      Lactation Tools Discussed/Used WIC Program: No   Consult Status Consult Status: PRN    Pamelia Hoit 12/03/2015, 10:40 AM

## 2015-12-03 NOTE — Progress Notes (Signed)
CRITICAL VALUE ALERT  Critical value received:  Troponin level = 0.03  Date of notification:  12/03/15  Time of notification:  2223  Critical value read back:Yes.    Nurse who received alert:  mlatham RN  MD notified (1st page):  Dr. Mora Appl  Time of first page:  2225  MD notified (2nd page):  Time of second page:  Responding MD:  Dr. Mora Appl  Time MD responded:  2228

## 2015-12-03 NOTE — Progress Notes (Signed)
Subjective: Postpartum Day 2: Cesarean Delivery Patient reports incisional pain, tolerating PO, + flatus, + BM and no problems voiding.   Patient c/o chest pressure and chest pain, no pleuritic chest pain, no shortness of breath.  She is ambulating w/o difficulty.   Objective: Vital signs in last 24 hours: Temp:  [97.9 F (36.6 C)-98.5 F (36.9 C)] 97.9 F (36.6 C) (07/29 1135) Pulse Rate:  [80-85] 81 (07/29 1135) Resp:  [18-20] 18 (07/29 1135) BP: (99-112)/(57-61) 105/60 (07/29 1135) SpO2:  [96 %-100 %] 100 % (07/29 1135)  Physical Exam:  General: alert, cooperative and no distress Lochia: appropriate Uterine Fundus: firm Incision: healing well, no significant drainage, no dehiscence, no significant erythema DVT Evaluation: No evidence of DVT seen on physical exam. Negative Homan's sign. No cords or calf tenderness. No significant calf/ankle edema.   Recent Labs  12/01/15 1658 12/02/15 0611  HGB 9.7* 8.4*  HCT 28.7* 25.1*    Assessment/Plan: Status post Cesarean section. Patient with chest pressure she appears in no distress, not ill appearing Discussed with patient that there is a low suspicion for MI, however will get a 12 Lead EKG and 3 sets of Cardiac enzymes Pulse ox x 1 now.  Patient hemodynamically stable.  Post op Hb low at 8.4. Will repeat Hb now.   Closely monitor.   Stephanie Hicks [name] 12/03/2015, 5:25 PM

## 2015-12-04 LAB — TROPONIN I: Troponin I: <0.03 ng/mL (ref <0.03)

## 2015-12-04 MED ORDER — OXYCODONE-ACETAMINOPHEN 7.5-325 MG PO TABS: 1.0000 | ORAL_TABLET | ORAL | 0 refills | Status: DC | PRN

## 2015-12-04 NOTE — Discharge Summary (Signed)
Obstetric Discharge Summary Reason for Admission: PPROM Prenatal Procedures: NST and ultrasound Intrapartum Procedures: cesarean: low cervical, transverse and GBS prophylaxis Postpartum Procedures: EKG Complications-Operative and Postpartum: chest pain, MI ruled out Hemoglobin  Date Value Ref Range Status  12/03/2015 10.1 (L) 12.0 - 15.0 g/dL Final   HCT  Date Value Ref Range Status  12/03/2015 30.5 (L) 36.0 - 46.0 % Final    Physical Exam:  General: alert, cooperative and no distress Lochia: appropriate Uterine Fundus: firm Incision: healing well, no significant drainage, no dehiscence, no significant erythema DVT Evaluation: No evidence of DVT seen on physical exam. Negative Homan's sign. No cords or calf tenderness.   Discharge Diagnoses: Antepartum bleeding and Premature labor  Discharge Information: Date: 12/04/2015 Activity: pelvic rest Diet: routine Medications: PNV and Percocet Condition: stable Instructions: refer to practice specific booklet Discharge to: home   Newborn Data: Live born female  Birth Weight: 3 lb 14.1 oz (1760 g) APGAR: 7, 8  NICU  Stephanie Hicks [name] 12/04/2015, 10:42 AM

## 2015-12-04 NOTE — Lactation Note (Signed)
This note was copied from a baby's chart. Lactation Consultation Note Breasts are beginning to fill and left breast has a  pink tender area on the 7-8:00 position.   SHowed mother how to perform therapeutic breast massage of lactation and the areas softened and breast color became more uniform. Increased  flange size to 24 because the 21 was slipping and possible occluding the exit on the left breast. Mother instructed to watch the area and to notify her primary care provider if it does not resolve.  Also reviewed how to use her Ameda breast pump and informed her that if her milk was not coming to volume she may want to obtain a hospital grade pump.  Follow-up as needed.  Patient Name: Stephanie Hicks [id number] Date: 12/04/2015 Reason for consult: Follow-up assessment   Maternal Data    Feeding Feeding Type: Donor Breast Milk (mixed with Mom's EBM) Length of feed: 30 min  LATCH Score/Interventions                      Lactation Tools Discussed/Used     Consult Status      Soyla Dryer 12/04/2015, 11:57 AM

## 2015-12-04 NOTE — Progress Notes (Signed)
Discharge teaching complete. Pt understood all instructions and did not have any questions. Pt ambulated out of the hospital and discharged home to family.  

## 2015-12-04 NOTE — Progress Notes (Signed)
Subjective: Postpartum Day 3: Cesarean Delivery Patient reports tolerating PO, + flatus, + BM and no problems voiding.   Patient notes that her chest pressure and chest pain has resolved.    Objective: Vital signs in last 24 hours: Temp:  [97.7 F (36.5 C)-99 F (37.2 C)] 98.1 F (36.7 C) (07/30 0500) Pulse Rate:  [78-87] 78 (07/30 0500) Resp:  [18] 18 (07/30 0500) BP: (100-113)/(60-84) 103/84 (07/30 0500) SpO2:  [96 %-100 %] 100 % (07/30 0500)  Physical Exam:  General: alert, cooperative and no distress Lochia: appropriate Uterine Fundus: firm Incision: healing well, no significant drainage, no dehiscence, no significant erythema DVT Evaluation: No evidence of DVT seen on physical exam. Negative Homan's sign. No cords or calf tenderness.   Recent Labs  12/02/15 0611 12/03/15 1751  HGB 8.4* 10.1*  HCT 25.1* 30.5*    Assessment/Plan: Status post Cesarean section. Doing well postoperatively.  Will discharge home with return precautions.  She will f/u in office for routine post partum visit.   Stephanie Hicks 12/04/2015, 10:36 AM

## 2015-12-05 ENCOUNTER — Ambulatory Visit: Payer: Self-pay

## 2015-12-05 NOTE — Lactation Note (Signed)
This note was copied from a baby's chart. Lactation Consultation Note  Patient Name: Stephanie Hicks Date: 12/05/2015   Follow up with mom in NICU. Mom reports that pumping is going well every 2-3 hours and she is getting 1-2 oz/pumping. She reports the hardness and lumpiness to breasts have resolved. She is pumping with the Symphony pump at the hospital and with her Ahmeda pump at home, she reports she gets the same amount of BM with both pumps. She reports that her right breast produces more than the left, told her this is normal for moms to experience a differnce between sides.   Enc mom to call with questions/concerns prn.      Maternal Data    Feeding    LATCH Score/Interventions                      Lactation Tools Discussed/Used     Consult Status      Ed Blalock 12/05/2015, 4:06 PM

## 2015-12-13 ENCOUNTER — Ambulatory Visit: Payer: Self-pay

## 2015-12-13 NOTE — Lactation Note (Signed)
This note was copied from a baby's chart. Lactation Consultation Note  Patient Name: Stephanie Hicks Reason for consult: Follow-up assessment;NICU baby Pecola LeisureBaby is currently 33.[redacted] weeks gestation.  Assisted mom nuzzle baby at breast.  She has a good supply and can easily hand express milk.  Baby awake and showing good interest.  Baby licking milk and also opened and latched well.  Baby suckled for several sucks and swallows. Discussed with mom normal preterm feeding expectations.  I told mom to expect inconsistencies and full feeds at breast may not occur until term or after.  Mom has a good attitude and comfortable with handling baby.  Encouraged to call with concerns/assist prn and to put baby to breast daily.  Maternal Data    Feeding Feeding Type: Breast Fed Length of feed: 15 min  LATCH Score/Interventions Latch: Repeated attempts needed to sustain latch, nipple held in mouth throughout feeding, stimulation needed to elicit sucking reflex. Intervention(s): Adjust position;Assist with latch;Breast massage;Breast compression  Audible Swallowing: A few with stimulation Intervention(s): Alternate breast massage;Hand expression  Type of Nipple: Everted at rest and after stimulation  Comfort (Breast/Nipple): Soft / non-tender     Hold (Positioning): Assistance needed to correctly position infant at breast and maintain latch. Intervention(s): Breastfeeding basics reviewed;Support Pillows;Position options  LATCH Score: 7  Lactation Tools Discussed/Used     Consult Status Consult Status: PRN    Huston FoleyMOULDEN, Marko Skalski [name], 3:22 PM

## 2015-12-29 ENCOUNTER — Ambulatory Visit: Payer: Self-pay

## 2015-12-29 NOTE — Lactation Note (Signed)
This note was copied from a baby's chart. Lactation Consultation Note  Patient Name: Stephanie Hicks ZOXWR'UToday's Date: 12/29/2015  Visit with parents in the NICU.  Mom states she has a significant lower supply from left breast x 1 week.  She did feel a plugged duct yesterday but it resolved with heat, massage and pumping.  She obtains 60 mls from right breast and 30 mls from left.  Instructed to try to power pump twice per day to increase supply.  She is not putting baby to breast.  Encouraged to try to latch baby once per day.  Mom will call for assist tomorrow.   Maternal Data    Feeding Feeding Type: Breast Milk Nipple Type: Slow - flow Length of feed: 10 min  LATCH Score/Interventions                      Lactation Tools Discussed/Used     Consult Status      Huston FoleyMOULDEN, Jarod Bozzo S 12/29/2015, 5:13 PM

## 2016-01-03 ENCOUNTER — Other Ambulatory Visit: Payer: Self-pay | Admitting: Obstetrics and Gynecology

## 2016-01-06 ENCOUNTER — Ambulatory Visit: Payer: Self-pay

## 2016-01-06 NOTE — Lactation Note (Signed)
This note was copied from a baby's chart. Lactation Consultation Note  Patient Name: Stephanie Hicks [id number]'ZToday's Date: 01/06/2016 Reason for consult: Follow-up assessment;NICU baby  NICU baby 445 weeks old. Mom reports that she has been nursing the baby well on right breast, which produces 2 ounces of EBM for every 1 ounce that left breast produces. Mom states that the baby will sometimes latch to left breast, but not consistently. Assisted mom to use hand pump in order to evert nipple just prior to latching baby, and baby able to latch onto to nipple deeply and maintain latch. Baby suckled rhythmically with a few swallows noted, but baby only suckled off-and-on for 5 minutes before sleeping. Enc mom to undress baby when and nurse STS when baby sleepy at breast. After unwrapping baby, baby still sleepy at breast. Enc mom to continue to offer STS and nuzzling at the breast. Mom aware of Galactagogues and power-pumping.  Mom reports that baby may be D/C'd Monday. Mom aware of OP/BFSG and LC phone line assistance after D/C.   Mom states that  Maternal Data    Feeding Feeding Type: Breast Fed Length of feed: 5 min (off-and-on)  LATCH Score/Interventions Latch: Repeated attempts needed to sustain latch, nipple held in mouth throughout feeding, stimulation needed to elicit sucking reflex. Intervention(s): Adjust position;Assist with latch;Breast compression  Audible Swallowing: A few with stimulation  Type of Nipple: Everted at rest and after stimulation (left breast small nipple, short shaft)  Comfort (Breast/Nipple): Soft / non-tender     Hold (Positioning): No assistance needed to correctly position infant at breast. Intervention(s): Skin to skin  LATCH Score: 8  Lactation Tools Discussed/Used     Consult Status Consult Status: PRN    Sherlyn HayJennifer D Akaysha Cobern 01/06/2016, 12:19 PM

## 2016-08-02 ENCOUNTER — Other Ambulatory Visit: Payer: Self-pay | Admitting: Obstetrics and Gynecology

## 2016-08-06 LAB — CYTOLOGY - PAP

## 2016-08-15 IMAGING — US US MFM OB COMP +14 WKS
1 series · 14 of 28 positions shown · non-contrast
Comparison: none

[Series 1: us mfm ob comp +14 wks · 80 acquisitions, 14 frames shown]
[im 3/80]
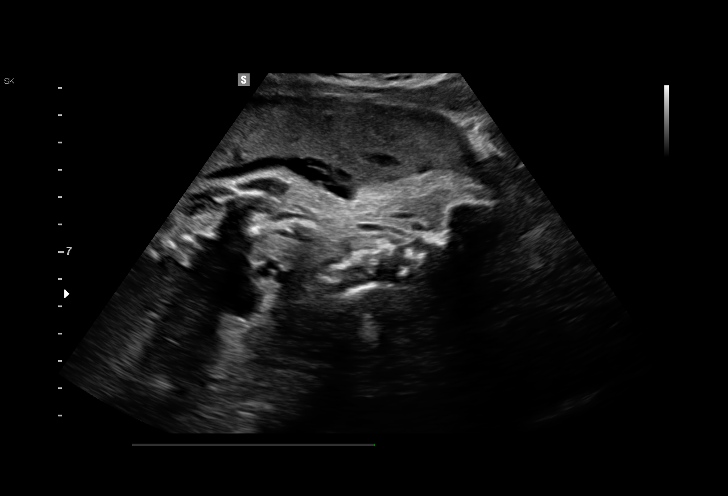
[im 9/80]
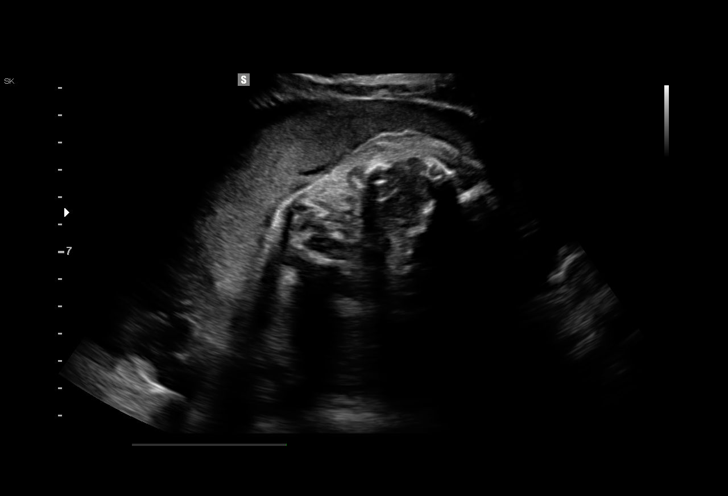
[im 15/80]
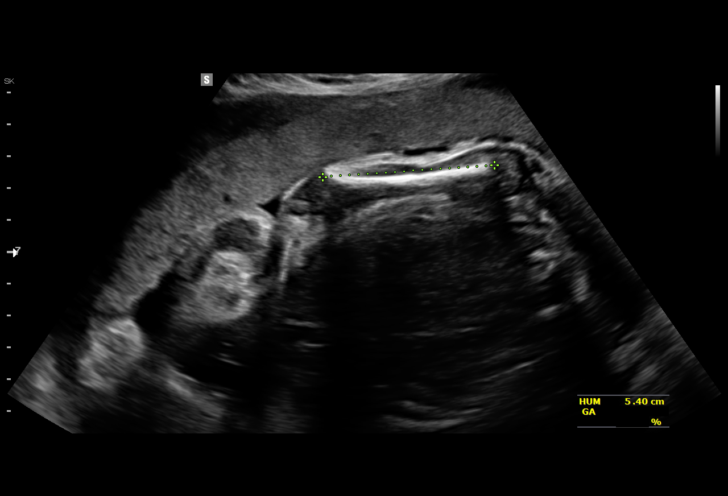
[im 21/80]
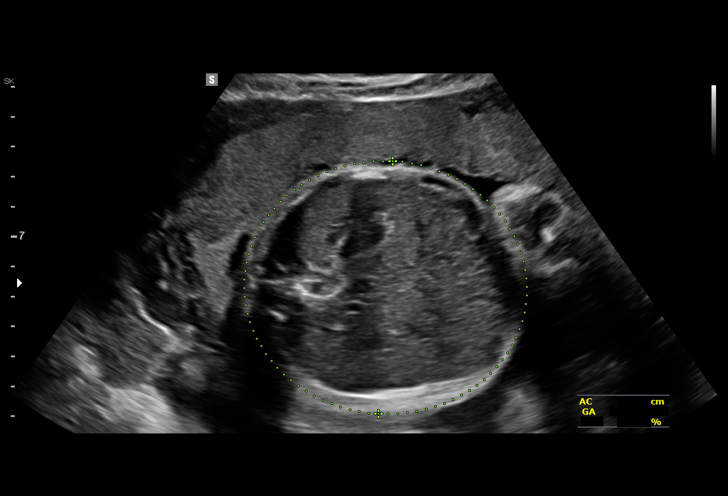
[im 27/80]
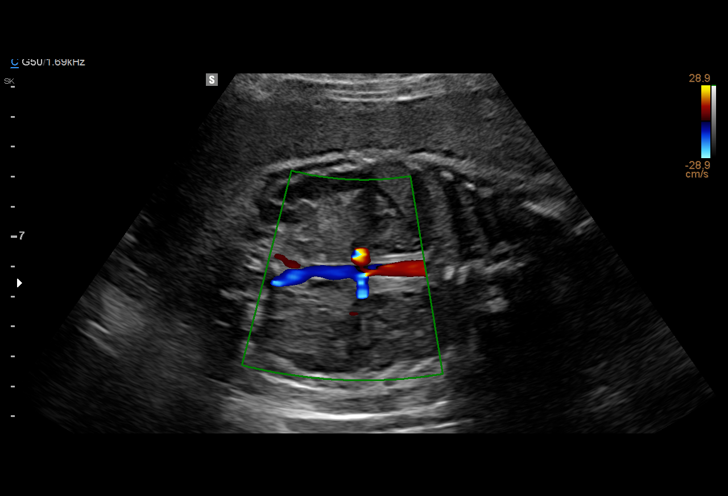
[im 33/80]
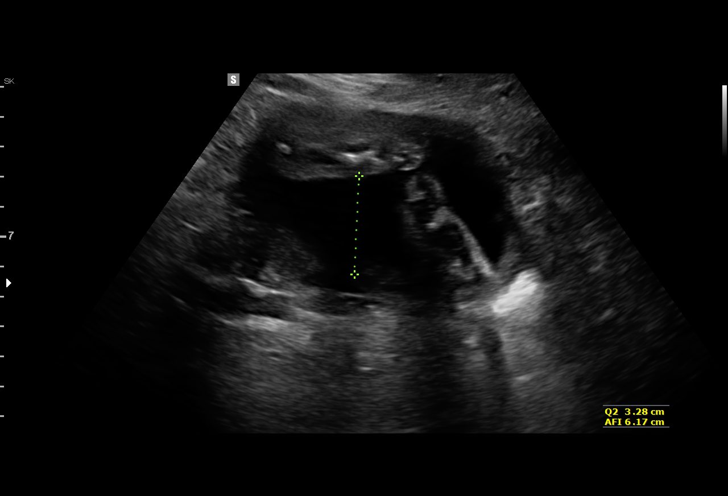
[im 39/80]
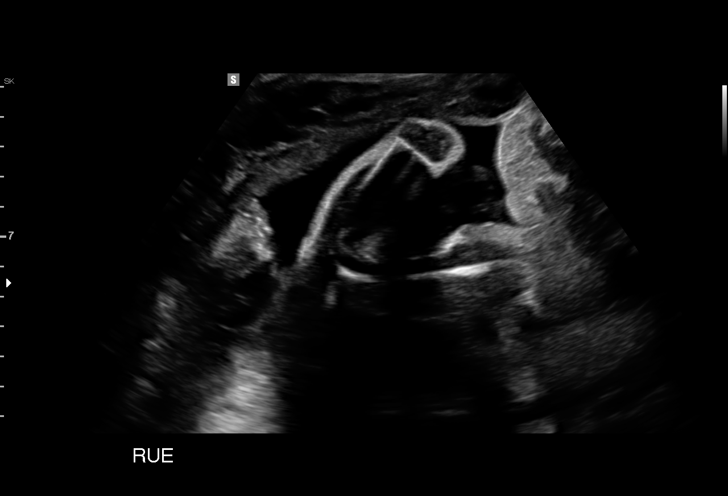
[im 44/80]
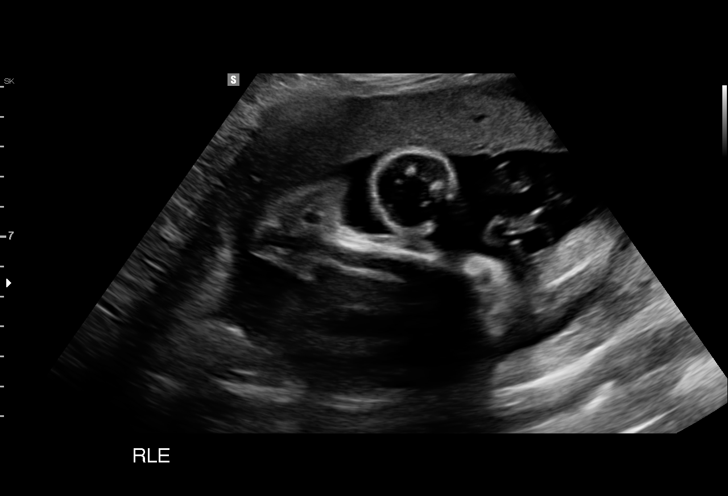
[im 50/80]
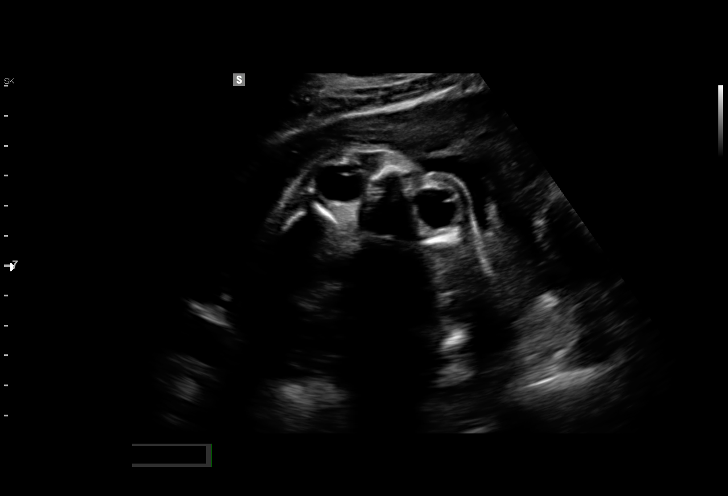
[im 56/80]
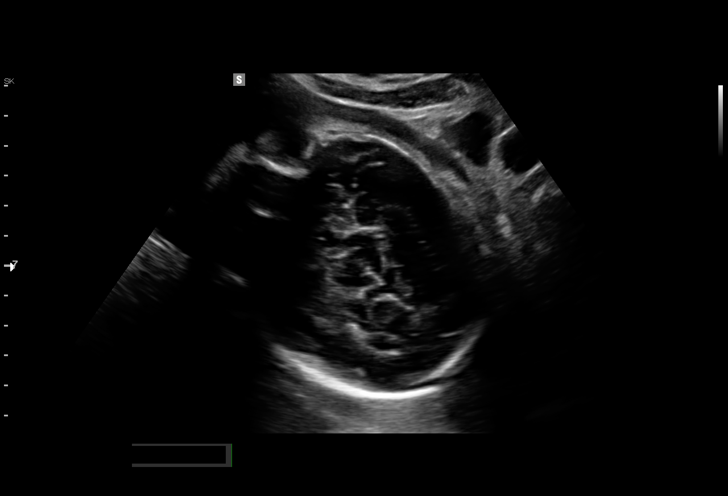
[im 62/80]
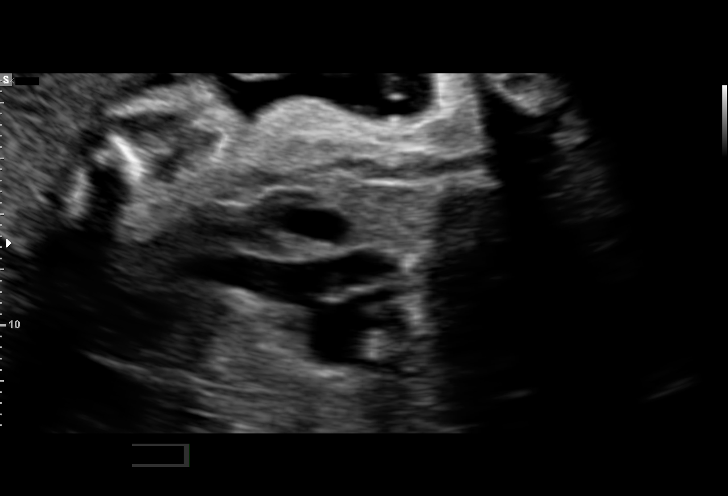
[im 68/80]
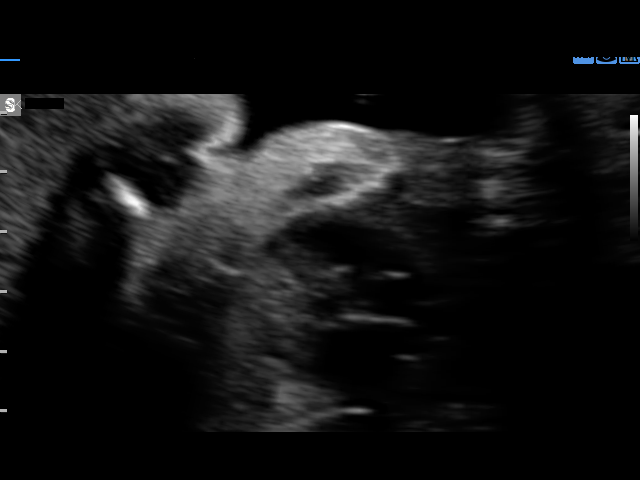
[im 74/80]
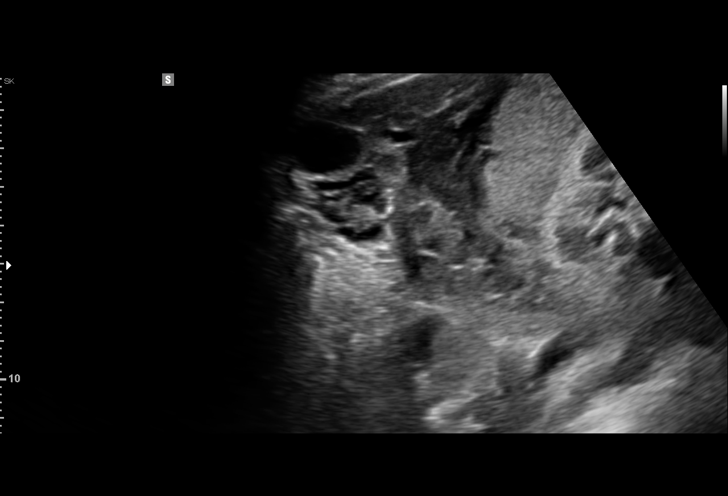
[im 80/80]
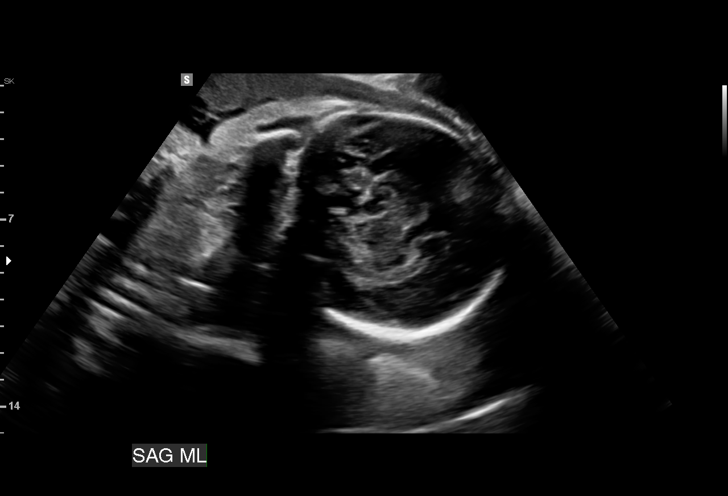

[14 of 28 positions shown; findings below may reference images not displayed]

Road; [HOSPITAL]
Attending:        Nadira Meek        Secondary Phy.:   [facility]-
MAU/Triage

1  DWI ARI MARSHA            [id number]      [id number]     [id number]
Indications

31 weeks gestation of pregnancy
Premature rupture of membranes - leaking
fluid
OB History

Gravidity:    1
Fetal Evaluation

Num Of Fetuses:     1
Fetal Heart         138
Rate(bpm):
Cardiac Activity:   Observed
Presentation:       Cephalic
Placenta:           Anterior, above cervical os
P. Cord Insertion:  Not well visualized

Amniotic Fluid
AFI FV:      Subjectively within normal limits

AFI Sum(cm)     %Tile       Largest Pocket(cm)
10.76           21

RUQ(cm)                     LUQ(cm)        LLQ(cm)
2.88
Biometry

BPD:        80  mm     G. Age:  32w 1d         66  %    CI:        75.71   %   70 - 86
FL/HC:      21.2   %   19.3 -
HC:      291.5  mm     G. Age:  32w 1d         36  %    HC/AC:      1.02       0.96 -
AC:      284.6  mm     G. Age:  32w 3d         80  %    FL/BPD:     77.3   %   71 - 87
FL:       61.8  mm     G. Age:  32w 0d         58  %    FL/AC:      21.7   %   20 - 24
HUM:      53.5  mm     G. Age:  31w 1d         49  %

Est. FW:    1562  gm      4 lb 5 oz     75  %
Gestational Age

Clinical EDD:  31w 2d                                        EDD:   01/30/16
U/S Today:     32w 1d                                        EDD:   01/24/16
Best:          31w 2d    Det. By:   Clinical EDD             EDD:   01/30/16
Anatomy

Cranium:               Appears normal         Aortic Arch:            Not well visualized
Cavum:                 Not well visualized    Ductal Arch:            Not well visualized
Ventricles:            Appears normal         Diaphragm:              Appears normal
Choroid Plexus:        Appears normal         Stomach:                Appears normal, left
sided
Cerebellum:            Not well visualized    Abdomen:                Appears normal
Posterior Fossa:       Not well visualized    Abdominal Wall:         Appears nml (cord
insert, abd wall)
Nuchal Fold:           Not applicable (>20    Cord Vessels:           Appears normal (3
wks GA)                                        vessel cord)
Face:                  Appears normal         Kidneys:                Appear normal
(orbits and profile)
Lips:                  Appears normal         Bladder:                Appears normal
Heart:                 Appears normal         Spine:                  Not well visualized
(4CH, axis, and
situs)
RVOT:                  Appears normal         Upper Extremities:      Appears normal
LVOT:                  Appears normal         Lower Extremities:      Appears normal

Other:  Nasal bone visualized. Technically difficult due to advanced GA and
fetal position.
Cervix Uterus Adnexa

Cervix
Not visualized (advanced GA >36wks)

Left Ovary
Within normal limits.

Right Ovary
Within normal limits.
Impression

Single IUP at 31w 2d
Suspected PROM
Somewhat limited views of the fetal anatomy obtained due to
fetal position and late gestational age
No gross anomalies noted
The estimated fetal weight is at the 75th %tile
Anterior placenta without previa
Normal amniotic fluid volume (AFI 10.8 cm)
Recommendations

Follow-up ultrasounds as clinically indicated.

## 2016-09-05 ENCOUNTER — Other Ambulatory Visit: Payer: Self-pay | Admitting: Obstetrics and Gynecology

## 2017-07-09 ENCOUNTER — Other Ambulatory Visit: Payer: Self-pay | Admitting: Obstetrics and Gynecology

## 2017-07-09 NOTE — H&P (Signed)
32 y.o. yo complains of persistent R and L lower quadrant pain despite IUD and then depo provera. Previously:"Pt presents today for GYN scan due to severe abdominal pain, cramping, and bloating. Pt states she also has been having some heavy bleeding with clots. Pt states the bleeding has been off and on since IUD has been placed. Pt states her spouse can feel during IC and she desires IUD to be removed. tg// \US shows Pelvic- 8x5x4, IUD in EM. Normal ovaries. Unlikely IUD but will remove. Still having irreg bleeding."    Past Medical History:  Diagnosis Date  . Pulmonary embolism (HCC) 2006   related to birth control pills   Past Surgical History:  Procedure Laterality Date  . CESAREAN SECTION N/A 12/01/2015   Procedure: CESAREAN SECTION;  Surgeon: Carrington ClampMichelle Joshuajames Moehring, MD;  [facility];  Service: Obstetrics;  Laterality: N/A;    Social History   Socioeconomic History  . Marital status: Married    Spouse name: Not on file  . Number of children: Not on file  . Years of education: Not on file  . Highest education level: Not on file  Social Needs  . Financial resource strain: Not on file  . Food insecurity - worry: Not on file  . Food insecurity - inability: Not on file  . Transportation needs - medical: Not on file  . Transportation needs - non-medical: Not on file  Occupational History  . Not on file  Tobacco Use  . Smoking status: Never Smoker  . Smokeless tobacco: Never Used  Substance and Sexual Activity  . Alcohol use: Not on file  . Drug use: Not on file  . Sexual activity: Yes  Other Topics Concern  . Not on file  Social History Narrative  . Not on file    No current facility-administered medications on file prior to encounter.    Current Outpatient Medications on File Prior to Encounter  Medication Sig Dispense Refill  . medroxyPROGESTERone Acetate 150 MG/ML SUSY Inject 1 mL into the muscle every 3 (three) months.  3  . Multiple Vitamins-Minerals  (MULTIVITAMIN WITH MINERALS) tablet Take 1 tablet by mouth daily.    Marland Kitchen. OVER THE COUNTER MEDICATION Take 300 mg by mouth daily. Chaste Berry Extract    . OVER THE COUNTER MEDICATION Take 33.3 mg by mouth daily as needed (pain during day). CBD oil cap    . traMADol (ULTRAM) 50 MG tablet Take 1 tablet by mouth at bedtime.  0    Allergies  Allergen Reactions  . Adhesive [Tape]     Surgical tape - causes skin irritation   . Dilaudid [Hydromorphone] Hives  . Naproxen Hives    There were no vitals filed for this visit.  Lungs: clear to ascultation Cor:  RRR Abdomen:  soft, nontender, nondistended. Ex:  no cords, erythema Pelvic:   Vulva: no masses, no atrophy, no lesions\ls1  Vagina: no tenderness, no erythema, no abnormal vaginal discharge, no vesicle(s) or ulcers, no cystocele, no rectocele\ls1   Cervix: grossly normal, no discharge, no cervical motion tenderness\ls1   Uterus: normal size, normal shape, midline, no uterine prolapse, mobile, tender (exam is exceedingly uncomfortable.)\ls1   Bladder/Urethra: normal meatus, no urethral discharge, no urethral mass, bladder non distended, Urethra well supported\ls1   Adnexa/Parametria: no parametrial tenderness, no parametrial mass, no adnexal tenderness, no ovarian mass  A:  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  Pt has failed multiple therapies and declines to do lupron  for now.  Pt desires future fertility.  Pt continues to have bloating, pain in pelvis, dyspareunia and irregular bleeding with normal [place].  Pt desires surgical therapy if possible.   P: All risks, benefits and alternatives d/w patient and she desires to proceed.  Patient will have SCDs during the operation and will get a shot of Lovenox before d/c to home.     Stephanie Hicks A

## 2017-07-15 NOTE — Patient Instructions (Signed)
Darrell Jewellecia O Wellons  07/15/2017   Your procedure is scheduled on: Tuesday 07/23/2017   [facility]   ARRIVE AT 530 AM. Have a seat in the Main Lobby. Please note there is a phone at the [employer]. Please call [phone] on that phone. Someone from Short Stay will come and get you from the Main Lobby and take you to Short Stay.    Call this number if you have problems the morning of surgery [phone]    Remember: Do not eat food or drink liquids :After Midnight.     Take these medicines the morning of surgery with A SIP OF WATER: none                                You may not have any metal on your body including hair pins and              piercings  Do not wear jewelry, make-up, lotions, powders or perfumes, deodorant             Do not wear nail polish.  Do not shave  48 hours prior to surgery.              Men may shave face and neck.   Do not bring valuables to the hospital. [facility] IS NOT             RESPONSIBLE   FOR VALUABLES.  Contacts, dentures or bridgework may not be worn into surgery.  Leave suitcase in the car. After surgery it may be brought to your room.                  Please read over the following fact sheets you were given: _____________________________________________________________________             [facility] Pre[facility] Before surgery, you can play an important role.  Because skin is not sterile, your skin needs to be as free of germs as possible.  You can reduce the number of germs on your skin by washing with CHG (chlorahexidine gluconate) soap before surgery.  CHG is an antiseptic cleaner which kills germs and bonds with the skin to continue killing germs even after washing. Please DO NOT use if you have an allergy to CHG or antibacterial soaps.  If your skin becomes reddened/irritated stop using the CHG and inform your nurse when you arrive at Short Stay. Do not  shave (including legs and underarms) for at least 48 hours prior to the first CHG shower.  You may shave your face/neck. Please follow these instructions carefully:  1.  Shower with CHG Soap the night before surgery and the  morning of Surgery.  2.  If you choose to wash your hair, wash your hair first as usual with your  normal  shampoo.  3.  After you shampoo, rinse your hair and body thoroughly to remove the  shampoo.                           4.  Use CHG as you would any other liquid soap.  You can apply chg directly  to the skin and wash  Gently with a scrungie or clean washcloth.  5.  Apply the CHG Soap to your body ONLY FROM THE NECK DOWN.   Do not use on face/ open                           Wound or open sores. Avoid contact with eyes, ears mouth and genitals (private parts).                       Wash face,  Genitals (private parts) with your normal soap.             6.  Wash thoroughly, paying special attention to the area where your surgery  will be performed.  7.  Thoroughly rinse your body with warm water from the neck down.  8.  DO NOT shower/wash with your normal soap after using and rinsing off  the CHG Soap.                9.  Pat yourself dry with a clean towel.            10.  Wear clean pajamas.            11.  Place clean sheets on your bed the night of your first shower and do not  sleep with pets. Day of Surgery : Do not apply any lotions/deodorants the morning of surgery.  Please wear clean clothes to the hospital/surgery center.  FAILURE TO FOLLOW THESE INSTRUCTIONS MAY RESULT IN THE CANCELLATION OF YOUR SURGERY PATIENT SIGNATURE_________________________________  NURSE SIGNATURE__________________________________

## 2017-07-16 ENCOUNTER — Other Ambulatory Visit: Payer: Self-pay

## 2017-07-16 ENCOUNTER — Encounter (HOSPITAL_COMMUNITY)
Admission: RE | Admit: 2017-07-16 | Discharge: 2017-07-16 | Disposition: A | Discharge status: Home / Self Care | Payer: BC Managed Care – PPO | Source: Ambulatory Visit | Attending: Obstetrics and Gynecology | Admitting: Obstetrics and Gynecology

## 2017-07-16 ENCOUNTER — Encounter (HOSPITAL_COMMUNITY): Payer: Self-pay | Admitting: *Deleted

## 2017-07-16 DIAGNOSIS — Z01818 Encounter for other preprocedural examination: Secondary | ICD-10-CM | POA: Insufficient documentation

## 2017-07-16 DIAGNOSIS — R1031 Right lower quadrant pain: Secondary | ICD-10-CM | POA: Insufficient documentation

## 2017-07-16 DIAGNOSIS — R1032 Left lower quadrant pain: Secondary | ICD-10-CM | POA: Insufficient documentation

## 2017-07-16 HISTORY — DX: Major depressive disorder, single episode, unspecified: F32.9

## 2017-07-16 HISTORY — DX: Headache: R51

## 2017-07-16 HISTORY — DX: Headache, unspecified: R51.9

## 2017-07-16 HISTORY — DX: Anxiety disorder, unspecified: F41.9

## 2017-07-16 HISTORY — DX: Depression, unspecified: F32.A

## 2017-07-16 LAB — CBC
HCT: 41.9 % (ref 36.0–46.0)
HEMOGLOBIN: 14.4 g/dL (ref 12.0–15.0)
MCH: 31.4 pg (ref 26.0–34.0)
MCHC: 34.4 g/dL (ref 30.0–36.0)
MCV: 91.5 fL (ref 78.0–100.0)
PLATELETS: 279 10*3/uL (ref 150–400)
RBC: 4.58 MIL/uL (ref 3.87–5.11)
RDW: 12.7 % (ref 11.5–15.5)
WBC: 7.4 10*3/uL (ref 4.0–10.5)

## 2017-07-16 LAB — PREGNANCY, URINE: PREG TEST UR: NEGATIVE

## 2017-07-16 LAB — ABO/RH: ABO/RH(D): B POS

## 2017-07-23 ENCOUNTER — Encounter (HOSPITAL_COMMUNITY): Payer: Self-pay

## 2017-07-23 ENCOUNTER — Ambulatory Visit (HOSPITAL_COMMUNITY)
Admission: RE | Admit: 2017-07-23 | Discharge: 2017-07-23 | Disposition: A | Discharge status: Home / Self Care | Payer: BC Managed Care – PPO | Source: Ambulatory Visit | Attending: Obstetrics and Gynecology | Admitting: Obstetrics and Gynecology

## 2017-07-23 ENCOUNTER — Ambulatory Visit (HOSPITAL_COMMUNITY): Payer: BC Managed Care – PPO | Admitting: Anesthesiology

## 2017-07-23 ENCOUNTER — Encounter (HOSPITAL_COMMUNITY)
Admission: RE | Disposition: A | Discharge status: Home / Self Care | Payer: Self-pay | Source: Ambulatory Visit | Attending: Obstetrics and Gynecology

## 2017-07-23 DIAGNOSIS — Z79899 Other long term (current) drug therapy: Secondary | ICD-10-CM | POA: Insufficient documentation

## 2017-07-23 DIAGNOSIS — F419 Anxiety disorder, unspecified: Secondary | ICD-10-CM | POA: Diagnosis not present

## 2017-07-23 DIAGNOSIS — F329 Major depressive disorder, single episode, unspecified: Secondary | ICD-10-CM | POA: Insufficient documentation

## 2017-07-23 DIAGNOSIS — N803 Endometriosis of pelvic peritoneum: Secondary | ICD-10-CM | POA: Diagnosis present

## 2017-07-23 LAB — TYPE AND SCREEN
ABO/RH(D): B POS
Antibody Screen: NEGATIVE

## 2017-07-23 SURGERY — LAPAROSCOPY, DIAGNOSTIC, ROBOT-ASSISTED
Anesthesia: General

## 2017-07-23 MED ORDER — OXYCODONE-ACETAMINOPHEN 5-325 MG PO TABS
ORAL_TABLET | ORAL | Status: DC
Start: 2017-07-23 — End: 2017-07-23
  Filled 2017-07-23: qty 1

## 2017-07-23 MED ORDER — LACTATED RINGERS IV SOLN: INTRAVENOUS | Status: DC

## 2017-07-23 MED ORDER — SUFENTANIL CITRATE 50 MCG/ML IV SOLN
INTRAVENOUS | Status: AC
  Filled 2017-07-23: qty 1

## 2017-07-23 MED ORDER — PHENYLEPHRINE 40 MCG/ML (10ML) SYRINGE FOR IV PUSH (FOR BLOOD PRESSURE SUPPORT)
PREFILLED_SYRINGE | INTRAVENOUS | Status: DC | PRN
  Administered 2017-07-23 (×2): 40 ug via INTRAVENOUS
  Administered 2017-07-23: 80 ug via INTRAVENOUS

## 2017-07-23 MED ORDER — SCOPOLAMINE 1 MG/3DAYS TD PT72
MEDICATED_PATCH | TRANSDERMAL | Status: AC
  Filled 2017-07-23: qty 1

## 2017-07-23 MED ORDER — SODIUM CHLORIDE 0.9 % IR SOLN
Status: DC | PRN
  Administered 2017-07-23: 3000 mL

## 2017-07-23 MED ORDER — SUGAMMADEX SODIUM 200 MG/2ML IV SOLN
INTRAVENOUS | Status: AC
  Filled 2017-07-23: qty 2

## 2017-07-23 MED ORDER — ONDANSETRON HCL 4 MG/2ML IJ SOLN
INTRAMUSCULAR | Status: DC | PRN
  Administered 2017-07-23: 4 mg via INTRAVENOUS

## 2017-07-23 MED ORDER — ROCURONIUM BROMIDE 10 MG/ML (PF) SYRINGE
PREFILLED_SYRINGE | INTRAVENOUS | Status: AC
  Filled 2017-07-23: qty 5

## 2017-07-23 MED ORDER — MIDAZOLAM HCL 2 MG/2ML IJ SOLN
INTRAMUSCULAR | Status: AC
  Filled 2017-07-23: qty 2

## 2017-07-23 MED ORDER — SUFENTANIL CITRATE 50 MCG/ML IV SOLN
INTRAVENOUS | Status: DC | PRN
  Administered 2017-07-23: 10 ug via INTRAVENOUS
  Administered 2017-07-23: 20 ug via INTRAVENOUS
  Administered 2017-07-23 (×2): 5 ug via INTRAVENOUS

## 2017-07-23 MED ORDER — FENTANYL CITRATE (PF) 100 MCG/2ML IJ SOLN
25.0000 ug | INTRAMUSCULAR | Status: DC | PRN
  Administered 2017-07-23 (×2): 50 ug via INTRAVENOUS

## 2017-07-23 MED ORDER — LIDOCAINE 2% (20 MG/ML) 5 ML SYRINGE
INTRAMUSCULAR | Status: AC
  Filled 2017-07-23: qty 5

## 2017-07-23 MED ORDER — PROPOFOL 10 MG/ML IV BOLUS
INTRAVENOUS | Status: DC | PRN
  Administered 2017-07-23: 110 mg via INTRAVENOUS

## 2017-07-23 MED ORDER — PROPOFOL 10 MG/ML IV BOLUS
INTRAVENOUS | Status: AC
  Filled 2017-07-23: qty 20

## 2017-07-23 MED ORDER — OXYCODONE-ACETAMINOPHEN 5-325 MG PO TABS: 1.0000 | ORAL_TABLET | ORAL | 0 refills | Status: DC | PRN

## 2017-07-23 MED ORDER — DEXAMETHASONE SODIUM PHOSPHATE 10 MG/ML IJ SOLN
INTRAMUSCULAR | Status: DC | PRN
  Administered 2017-07-23: 10 mg via INTRAVENOUS

## 2017-07-23 MED ORDER — FENTANYL CITRATE (PF) 100 MCG/2ML IJ SOLN
INTRAMUSCULAR | Status: AC
  Administered 2017-07-23: 50 ug via INTRAVENOUS
  Filled 2017-07-23: qty 2

## 2017-07-23 MED ORDER — HYDROMORPHONE HCL 1 MG/ML IJ SOLN: 0.2500 mg | INTRAMUSCULAR | Status: DC | PRN

## 2017-07-23 MED ORDER — SODIUM CHLORIDE 0.9 % IJ SOLN
INTRAMUSCULAR | Status: AC
  Filled 2017-07-23: qty 50

## 2017-07-23 MED ORDER — MEPERIDINE HCL 50 MG/ML IJ SOLN: 6.2500 mg | INTRAMUSCULAR | Status: DC | PRN

## 2017-07-23 MED ORDER — OXYCODONE-ACETAMINOPHEN 5-325 MG PO TABS
1.0000 | ORAL_TABLET | Freq: Once | ORAL | Status: AC
  Administered 2017-07-23: 1 via ORAL

## 2017-07-23 MED ORDER — PROMETHAZINE HCL 25 MG/ML IJ SOLN: 6.2500 mg | INTRAMUSCULAR | Status: DC | PRN

## 2017-07-23 MED ORDER — MIDAZOLAM HCL 2 MG/2ML IJ SOLN
INTRAMUSCULAR | Status: DC | PRN
  Administered 2017-07-23 (×2): 1 mg via INTRAVENOUS

## 2017-07-23 MED ORDER — ROPIVACAINE HCL 5 MG/ML IJ SOLN
INTRAMUSCULAR | Status: DC | PRN
  Administered 2017-07-23: 120 mL

## 2017-07-23 MED ORDER — LACTATED RINGERS IV SOLN
INTRAVENOUS | Status: DC | PRN
  Administered 2017-07-23 (×2): via INTRAVENOUS

## 2017-07-23 MED ORDER — ROCURONIUM BROMIDE 10 MG/ML (PF) SYRINGE
PREFILLED_SYRINGE | INTRAVENOUS | Status: DC | PRN
  Administered 2017-07-23: 50 mg via INTRAVENOUS
  Administered 2017-07-23: 10 mg via INTRAVENOUS
  Administered 2017-07-23: 20 mg via INTRAVENOUS

## 2017-07-23 MED ORDER — SUGAMMADEX SODIUM 200 MG/2ML IV SOLN
INTRAVENOUS | Status: DC | PRN
  Administered 2017-07-23: 150 mg via INTRAVENOUS

## 2017-07-23 MED ORDER — PHENYLEPHRINE 40 MCG/ML (10ML) SYRINGE FOR IV PUSH (FOR BLOOD PRESSURE SUPPORT)
PREFILLED_SYRINGE | INTRAVENOUS | Status: AC
  Filled 2017-07-23: qty 10

## 2017-07-23 MED ORDER — ROPIVACAINE HCL 5 MG/ML IJ SOLN
INTRAMUSCULAR | Status: AC
  Filled 2017-07-23: qty 30

## 2017-07-23 MED ORDER — SCOPOLAMINE 1 MG/3DAYS TD PT72
MEDICATED_PATCH | TRANSDERMAL | Status: DC | PRN
  Administered 2017-07-23: 1 via TRANSDERMAL

## 2017-07-23 MED ORDER — DEXAMETHASONE SODIUM PHOSPHATE 10 MG/ML IJ SOLN
INTRAMUSCULAR | Status: AC
  Filled 2017-07-23: qty 1

## 2017-07-23 MED ORDER — STERILE WATER FOR IRRIGATION IR SOLN
Status: DC | PRN
  Administered 2017-07-23: 1000 mL

## 2017-07-23 MED ORDER — ONDANSETRON HCL 4 MG/2ML IJ SOLN
INTRAMUSCULAR | Status: AC
  Filled 2017-07-23: qty 2

## 2017-07-23 MED ORDER — LIDOCAINE 2% (20 MG/ML) 5 ML SYRINGE
INTRAMUSCULAR | Status: DC | PRN
  Administered 2017-07-23: 100 mg via INTRAVENOUS

## 2017-07-23 MED ORDER — SODIUM CHLORIDE 0.9 % IJ SOLN
INTRAMUSCULAR | Status: AC
  Filled 2017-07-23: qty 10

## 2017-07-23 SURGICAL SUPPLY — 61 items
ADH SKN CLS APL DERMABOND .7 (GAUZE/BANDAGES/DRESSINGS) ×1
APL ESCP 34 STRL LF DISP (HEMOSTASIS) ×1
APPLICATOR SURGIFLO ENDO (HEMOSTASIS) ×2 IMPLANT
BARRIER ADHS 3X4 INTERCEED (GAUZE/BANDAGES/DRESSINGS) ×2 IMPLANT
BRR ADH 4X3 ABS CNTRL BYND (GAUZE/BANDAGES/DRESSINGS) ×1
CABLE HIGH FREQUENCY MONO STRZ (ELECTRODE) IMPLANT
CATH ROBINSON RED A/P 16FR (CATHETERS) ×1 IMPLANT
CONT PATH 16OZ SNAP LID 3702 (MISCELLANEOUS) ×1 IMPLANT
COVER BACK TABLE 60X90IN (DRAPES) ×1 IMPLANT
COVER MAYO STAND STRL (DRAPES) ×1 IMPLANT
DECANTER SPIKE VIAL GLASS SM (MISCELLANEOUS) IMPLANT
DERMABOND ADVANCED (GAUZE/BANDAGES/DRESSINGS) ×2
DERMABOND ADVANCED .7 DNX12 (GAUZE/BANDAGES/DRESSINGS) ×1 IMPLANT
DRAPE UNDERBUTTOCKS STRL (DRAPE) ×1 IMPLANT
DRSG OPSITE POSTOP 3X4 (GAUZE/BANDAGES/DRESSINGS) ×1 IMPLANT
DURAPREP 26ML APPLICATOR (WOUND CARE) ×3 IMPLANT
FORMULA 20CAL 3 OZ MEAD (FORMULA) ×2 IMPLANT
GLOVE BIO SURGEON STRL SZ7 (GLOVE) ×6 IMPLANT
GLOVE BIOGEL PI IND STRL 6.5 (GLOVE) ×1 IMPLANT
GLOVE BIOGEL PI IND STRL 7.0 (GLOVE) ×3 IMPLANT
GLOVE BIOGEL PI INDICATOR 6.5 (GLOVE)
GLOVE BIOGEL PI INDICATOR 7.0 (GLOVE) ×6
LEGGING LITHOTOMY PAIR STRL (DRAPES) ×1 IMPLANT
LIGASURE VESSEL 5MM BLUNT TIP (ELECTROSURGICAL) IMPLANT
MARKER SKIN DUAL TIP RULER LAB (MISCELLANEOUS) ×2 IMPLANT
NEEDLE INSUFFLATION 120MM (ENDOMECHANICALS) ×3 IMPLANT
NS IRRIG 1000ML POUR BTL (IV SOLUTION) ×3 IMPLANT
PACK LAVH (CUSTOM PROCEDURE TRAY) ×1 IMPLANT
PACK ROBOT WH (CUSTOM PROCEDURE TRAY) ×2 IMPLANT
PACK ROBOTIC GOWN (GOWN DISPOSABLE) ×1 IMPLANT
PACK TRENDGUARD 450 HYBRID PRO (MISCELLANEOUS) IMPLANT
PACK TRENDGUARD 600 HYBRD PROC (MISCELLANEOUS) IMPLANT
POUCH LAPAROSCOPIC INSTRUMENT (MISCELLANEOUS) ×1 IMPLANT
PROTECTOR NERVE ULNAR (MISCELLANEOUS) ×2 IMPLANT
SCISSORS LAP 5X35 DISP (ENDOMECHANICALS) IMPLANT
SEALER TISSUE G2 CVD JAW 35 (ENDOMECHANICALS) IMPLANT
SEALER TISSUE G2 CVD JAW 45CM (ENDOMECHANICALS)
SET CYSTO W/LG BORE CLAMP LF (SET/KITS/TRAYS/PACK) IMPLANT
SET IRRIG TUBING LAPAROSCOPIC (IRRIGATION / IRRIGATOR) ×2 IMPLANT
SET TRI-LUMEN FLTR TB AIRSEAL (TUBING) ×2 IMPLANT
SLEEVE XCEL OPT CAN 5 100 (ENDOMECHANICALS) IMPLANT
SOLUTION ANTI FOG 6CC (MISCELLANEOUS) ×2 IMPLANT
SOLUTION ELECTROLUBE (MISCELLANEOUS) IMPLANT
SUT VIC AB 0 CT1 18XCR BRD8 (SUTURE) ×2 IMPLANT
SUT VIC AB 0 CT1 8-18 (SUTURE)
SUT VIC AB 2-0 CT1 27 (SUTURE)
SUT VIC AB 2-0 CT1 TAPERPNT 27 (SUTURE) ×2 IMPLANT
SUT VIC AB 2-0 CT2 27 (SUTURE) ×2 IMPLANT
SUT VIC AB 2-0 UR6 27 (SUTURE) ×1 IMPLANT
SUT VICRYL RAPIDE 3 0 (SUTURE) ×6 IMPLANT
SYR 50ML LL SCALE MARK (SYRINGE) ×3 IMPLANT
SYRINGE IRR TOOMEY STRL 70CC (SYRINGE) ×1 IMPLANT
TOWEL OR 17X24 6PK STRL BLUE (TOWEL DISPOSABLE) ×6 IMPLANT
TRAY FOLEY CATH SILVER 14FR (SET/KITS/TRAYS/PACK) ×3 IMPLANT
TRENDGUARD 450 HYBRID PRO PACK (MISCELLANEOUS) ×3
TRENDGUARD 600 HYBRID PROC PK (MISCELLANEOUS)
TROCAR OPTI TIP 5M 100M (ENDOMECHANICALS) IMPLANT
TROCAR PORT AIRSEAL 5X120 (TROCAR) ×2 IMPLANT
TROCAR PORT AIRSEAL 8X120 (TROCAR) IMPLANT
TROCAR XCEL DIL TIP R 11M (ENDOMECHANICALS) ×1 IMPLANT
WARMER LAPAROSCOPE (MISCELLANEOUS) ×1 IMPLANT

## 2017-07-23 NOTE — Progress Notes (Signed)
There has been no change in the patients history, status or exam since the history and physical.  Vitals:   07/23/17 0544 07/23/17 0600  BP: 119/69   Pulse: 75   Resp: 16   Temp: 98.6 F (37 C)   TempSrc: Oral   SpO2: 100%   Weight:  157 lb 3.2 oz (71.3 kg)  Height:  5\' 6"  (1.676 m)    No results found for this or any previous visit (from the past 72 hour(s)).  Navayah Sok A

## 2017-07-23 NOTE — Anesthesia Preprocedure Evaluation (Addendum)
Anesthesia Evaluation  Patient identified by MRN, date of birth, ID band Patient awake    Reviewed: Allergy & Precautions, NPO status , Patient's Chart, lab work & pertinent test results  Airway Mallampati: II  TM Distance: >3 FB Neck ROM: Full    Dental  (+) Chipped,    Pulmonary    Pulmonary exam normal        Cardiovascular negative cardio ROS   Rhythm:Regular Rate:Normal     Neuro/Psych  Headaches, PSYCHIATRIC DISORDERS Anxiety Depression    GI/Hepatic negative GI ROS, Neg liver ROS,   Endo/Other  negative endocrine ROS  Renal/GU negative Renal ROS  negative genitourinary   Musculoskeletal negative musculoskeletal ROS (+)   Abdominal Normal abdominal exam  (+)   Peds  Hematology negative hematology ROS (+)   Anesthesia Other Findings   Reproductive/Obstetrics                            Lab Results  Component Value Date   WBC 7.4 07/16/2017   HGB 14.4 07/16/2017   HCT 41.9 07/16/2017   MCV 91.5 07/16/2017   PLT 279 07/16/2017   Lab Results  Component Value Date   CREATININE 0.43 (L) 12/01/2015   No results found for: INR, PROTIME   Anesthesia Physical Anesthesia Plan  ASA: II  Anesthesia Plan: General   Post-op Pain Management:    Induction: Intravenous  PONV Risk Score and Plan: 4 or greater and Ondansetron, Dexamethasone, Midazolam and Scopolamine patch - Pre-op  Airway Management Planned: Oral ETT  Additional Equipment: None  Intra-op Plan:   Post-operative Plan: Extubation in OR  Informed Consent: I have reviewed the patients History and Physical, chart, labs and discussed the procedure including the risks, benefits and alternatives for the proposed anesthesia with the patient or authorized representative who has indicated his/her understanding and acceptance.   Dental advisory given  Plan Discussed with: CRNA  Anesthesia Plan Comments:         Anesthesia Quick Evaluation

## 2017-07-23 NOTE — Transfer of Care (Signed)
Immediate Anesthesia Transfer of Care Note  Patient: Stephanie Hicks  Procedure(s) Performed: XI ROBOT OPERATIVE  LAPAROSCOPY, RESECTION OF PERITONEAL ENDOMETRIOSIS, CAUTERY OF ENDOMETRIOSIS INTERCEED PLACEMENT (N/A )  Patient Location: PACU  Anesthesia Type:General  Level of Consciousness: awake and alert   Airway & Oxygen Therapy: Patient Spontanous Breathing and Patient connected to face mask oxygen  Post-op Assessment: Report given to RN and Post -op Vital signs reviewed and stable  Post vital signs: Reviewed and stable  Last Vitals:  Vitals:   07/23/17 0544  BP: 119/69  Pulse: 75  Resp: 16  Temp: 37 C  SpO2: 100%    Last Pain:  Vitals:   07/23/17 0600  TempSrc:   PainSc: 2       Patients Stated Pain Goal: 1 (07/23/17 0600)  Complications: No apparent anesthesia complications

## 2017-07-23 NOTE — Anesthesia Procedure Notes (Signed)
Procedure Name: Intubation Date/Time: 07/23/2017 7:44 AM Performed by: Florene Routeeardon, Florine Sprenkle L, CRNA Patient Re-evaluated:Patient Re-evaluated prior to induction Oxygen Delivery Method: Circle system utilized Preoxygenation: Pre-oxygenation with 100% oxygen Induction Type: IV induction Ventilation: Mask ventilation without difficulty and Oral airway inserted - appropriate to patient size Laryngoscope Size: Hyacinth MeekerMiller and 2 Grade View: Grade I Tube type: Oral Tube size: 7.0 mm Number of attempts: 1 Airway Equipment and Method: Stylet Placement Confirmation: ETT inserted through vocal cords under direct vision,  positive ETCO2 and breath sounds checked- equal and bilateral Secured at: 21 cm Tube secured with: Tape Dental Injury: Teeth and Oropharynx as per pre-operative assessment

## 2017-07-23 NOTE — Op Note (Signed)
07/23/2017  9:23 AM  PATIENT:  Stephanie Hicks  32 y.o. female  PRE-OPERATIVE DIAGNOSIS:  Right and left lower quardant pain  POST-OPERATIVE DIAGNOSIS:  Right and left lower quardant pain  PROCEDURE:  Operative laparoscopy, resection of peritoneum with endometriosis, cautery of endometriosis, placement of interceed with suture  SURGEON:  Surgeon(s) and Role:    Carrington Clamp, MD - Primary    * Philip Aspen, DO - Assisting   ANESTHESIA:   general  EBL:  25 mL   LOCAL MEDICATIONS USED:  ROPIVICAINE, interceed   SPECIMEN:  Source of Specimen:  cul de sac peritoneum  DISPOSITION OF SPECIMEN:  PATHOLOGY  COUNTS:  YES  TOURNIQUET:  * No tourniquets in log *  DICTATION: .Note written in EPIC  PLAN OF CARE: Discharge to home after PACU  PATIENT DISPOSITION:  PACU - hemodynamically stable.   Delay start of Pharmacological VTE agent (>24hrs) due to surgical blood loss or risk of bleeding: not applicable  Complications:  None.  Findings:  Fine distribution of clear plaques in cul de sac on R, below level of uterosacral on R.  R ovary had tiny clear plaques that were cauterized.  R tube had 3 clear plaques that were carefully and superficially cauterized.  The fundus had a single clear plaque.  Liver edge, appendix, anterior cul de sac and broad ligaments were otherwise normal.  The ureters were seen well out of all fields of dissection.  Technique:  After adequate general anesthesia was achieved, the patient was prepped and draped in sterile fashion.  The speculum was placed into the vagina and the uterine manipulator placed in the cervix. The speculum was removed and a catheter placed to drain the bladder during the surgery.  Attention was turned to the abdomen and a  1 cm incision was made above the umbilicus.  The veress needle passed into the abdomen without aspiration of bowel contents or blood.  The 8.5 mm trocar for the camera port was introduced after insuflatation  and the above findings noted.  Two 8.5 mm trocars were introduced 10 cm either side of the camera port all under direct visualization.  The fenestrated bipolar forceps were introduced on arm 2 and the Hot Shears on arm 1.    A careful examination of the entire pelvis was done and the above findings noted.  The cul de sac peritoneum (very close to where the pt's pain is) was covered with a fine distribution of clear plaques.  The peritoneum below the R uterosacral was tented up and entered into.  The peritoneum was then folded medially while sweeping underlying fat off until the midline where there were no other plaques.  The peritoneum was then incised superiorly. The under lying fat and colon were bluntly peeled off the peritoneum inferiorly and the single thin piece of peritoneum excised, far from any structures such as the colon or ureter.  This piece was removed and sent to path.  A needle driver replaced the hot shears and Interceed and a stitch of 2- Vicryl were introduced in through the camera port.  The Interceed was laid over the peritoneal defect and then sewn into place with four stitches of 2-0 vicryl.  The plaques on the uterus, R ovary and R tube were then cauterized superficially with the hot shears.  Ropivicaine was instilled into peritoneum.  All instruments were removed and the abdomen desuflated.  All skin incisions were closed with subcuticular stitches and Dermabond.  All instruments were  withdrawn from the vagina.  Pt tolerated the procedure well and was returned to the recovery room in stable condition.    Davonte Siebenaler A

## 2017-07-23 NOTE — Brief Op Note (Signed)
07/23/2017  9:23 AM  PATIENT:  Stephanie Hicks  32 y.o. female  PRE-OPERATIVE DIAGNOSIS:  Right and left lower quardant pain  POST-OPERATIVE DIAGNOSIS:  Right and left lower quardant pain  PROCEDURE:  Operative laparoscopy, resection of peritoneum with endometriosis, cautery of endometriosis, placement of interceed with suture  SURGEON:  Surgeon(s) and Role:    Carrington Clamp* Tashawnda Bleiler, MD - Primary    * Philip Aspenallahan, [place], DO - Assisting   ANESTHESIA:   general  EBL:  25 mL   LOCAL MEDICATIONS USED:  BUPIVICAINE   SPECIMEN:  Source of Specimen:  cul de sac peritoneum  DISPOSITION OF SPECIMEN:  PATHOLOGY  COUNTS:  YES  TOURNIQUET:  * No tourniquets in log *  DICTATION: .Note written in EPIC  PLAN OF CARE: Discharge to home after PACU  PATIENT DISPOSITION:  PACU - hemodynamically stable.   Delay start of Pharmacological VTE agent (>24hrs) due to surgical blood loss or risk of bleeding: not applicable

## 2017-07-23 NOTE — Discharge Instructions (Signed)
Diagnostic Laparoscopy, Care After  Refer to this sheet in the next few weeks. These instructions provide you with information about caring for yourself after your procedure. Your health care provider may also give you more specific instructions. Your treatment has been planned according to current medical practices, but problems sometimes occur. Call your health care provider if you have any problems or questions after your procedure.  What can I expect after the procedure?  After your procedure, it is common to have mild discomfort in the throat and abdomen.  Follow these instructions at home:  · Take over-the-counter and prescription medicines only as told by your health care provider.  · Do not drive for 24 hours if you received a sedative.  · Return to your normal activities as told by your health care provider.  · Do not take baths, swim, or use a hot tub until your health care provider approves. You may shower.  · Follow instructions from your health care provider about how to take care of your incision. Make sure you:  ? Wash your hands with soap and water before you change your bandage (dressing). If soap and water are not available, use hand sanitizer.  ? Change your dressing as told by your health care provider.  ? Leave stitches (sutures), skin glue, or adhesive strips in place. These skin closures may need to stay in place for 2 weeks or longer. If adhesive strip edges start to loosen and curl up, you may trim the loose edges. Do not remove adhesive strips completely unless your health care provider tells you to do that.  · Check your incision area every day for signs of infection. Check for:  ? More redness, swelling, or pain.  ? More fluid or blood.  ? Warmth.  ? Pus or a bad smell.  · It is your responsibility to get the results of your procedure. Ask your health care provider or the department performing the procedure when your results will be ready.  Contact a health care provider if:  · There is  new pain in your shoulders.  · You feel light-headed or faint.  · You are unable to pass gas or unable to have a bowel movement.  · You feel nauseous or you vomit.  · You develop a rash.  · You have more redness, swelling, or pain around your incision.  · You have more fluid or blood coming from your incision.  · Your incision feels warm to the touch.  · You have pus or a bad smell coming from your incision.  · You have a fever or chills.  Get help right away if:  · Your pain is getting worse.  · You have ongoing vomiting.  · The edges of your incision open up.  · You have trouble breathing.  · You have chest pain.  This information is not intended to replace advice given to you by your health care provider. Make sure you discuss any questions you have with your health care provider.  Document Released: 04/04/2015 Document Revised: 09/29/2015 Document Reviewed: 01/04/2015  Elsevier Interactive Patient Education © 2018 [employer]

## 2017-07-25 NOTE — Anesthesia Postprocedure Evaluation (Signed)
Anesthesia Post Note  Patient: Darrell JewelAlecia O Pucci  Procedure(s) Performed: XI ROBOT OPERATIVE  LAPAROSCOPY, RESECTION OF PERITONEAL ENDOMETRIOSIS, CAUTERY OF ENDOMETRIOSIS INTERCEED PLACEMENT (N/A )     Patient location during evaluation: PACU Anesthesia Type: General Level of consciousness: awake and alert Pain management: pain level controlled Vital Signs Assessment: post-procedure vital signs reviewed and stable Respiratory status: spontaneous breathing, nonlabored ventilation, respiratory function stable and patient connected to nasal cannula oxygen Cardiovascular status: blood pressure returned to baseline and stable Postop Assessment: no apparent nausea or vomiting Anesthetic complications: no    Last Vitals:  Vitals:   07/23/17 1030 07/23/17 1135  BP: 111/66 100/65  Pulse: 69 95  Resp: 16 18  Temp:  36.8 C  SpO2: 99% 100%                 Shelton SilvasKevin D Davon Folta

## 2018-05-19 LAB — OB RESULTS CONSOLE ANTIBODY SCREEN: Antibody Screen: NEGATIVE

## 2018-05-19 LAB — OB RESULTS CONSOLE RUBELLA ANTIBODY, IGM: Rubella: IMMUNE

## 2018-05-19 LAB — OB RESULTS CONSOLE RPR: RPR: NONREACTIVE

## 2018-05-19 LAB — OB RESULTS CONSOLE HEPATITIS B SURFACE ANTIGEN: Hepatitis B Surface Ag: NEGATIVE

## 2018-05-19 LAB — OB RESULTS CONSOLE HIV ANTIBODY (ROUTINE TESTING): HIV: NONREACTIVE

## 2018-05-19 LAB — OB RESULTS CONSOLE ABO/RH: RH Type: POSITIVE

## 2018-05-20 ENCOUNTER — Other Ambulatory Visit: Payer: Self-pay | Admitting: Obstetrics

## 2018-05-20 DIAGNOSIS — N6310 Unspecified lump in the right breast, unspecified quadrant: Secondary | ICD-10-CM

## 2018-06-02 ENCOUNTER — Ambulatory Visit
Admission: RE | Admit: 2018-06-02 | Discharge: 2018-06-02 | Disposition: A | Discharge status: Home / Self Care | Payer: BC Managed Care – PPO | Source: Ambulatory Visit | Attending: Obstetrics | Admitting: Obstetrics

## 2018-06-02 ENCOUNTER — Other Ambulatory Visit: Payer: Self-pay | Admitting: Obstetrics

## 2018-06-02 DIAGNOSIS — N6310 Unspecified lump in the right breast, unspecified quadrant: Secondary | ICD-10-CM

## 2018-06-04 ENCOUNTER — Other Ambulatory Visit: Payer: Self-pay | Admitting: Obstetrics

## 2018-06-04 ENCOUNTER — Ambulatory Visit
Admission: RE | Admit: 2018-06-04 | Discharge: 2018-06-04 | Disposition: A | Discharge status: Home / Self Care | Payer: BC Managed Care – PPO | Source: Ambulatory Visit | Attending: Obstetrics | Admitting: Obstetrics

## 2018-06-04 DIAGNOSIS — N6310 Unspecified lump in the right breast, unspecified quadrant: Secondary | ICD-10-CM

## 2018-12-08 LAB — OB RESULTS CONSOLE GBS: GBS: NEGATIVE

## 2018-12-17 ENCOUNTER — Telehealth (HOSPITAL_COMMUNITY): Payer: Self-pay | Admitting: *Deleted

## 2018-12-17 ENCOUNTER — Other Ambulatory Visit: Payer: Self-pay | Admitting: Obstetrics

## 2018-12-17 ENCOUNTER — Encounter (HOSPITAL_COMMUNITY): Payer: Self-pay | Admitting: *Deleted

## 2018-12-17 NOTE — Telephone Encounter (Signed)
Preadmission screen  

## 2018-12-19 ENCOUNTER — Encounter (HOSPITAL_COMMUNITY): Payer: Self-pay | Admitting: *Deleted

## 2018-12-28 ENCOUNTER — Other Ambulatory Visit (HOSPITAL_COMMUNITY)
Admission: RE | Admit: 2018-12-28 | Discharge: 2018-12-28 | Disposition: A | Discharge status: Home / Self Care | Payer: BC Managed Care – PPO | Source: Ambulatory Visit | Attending: Obstetrics & Gynecology | Admitting: Obstetrics & Gynecology

## 2018-12-28 ENCOUNTER — Other Ambulatory Visit: Payer: Self-pay

## 2018-12-28 DIAGNOSIS — Z20828 Contact with and (suspected) exposure to other viral communicable diseases: Secondary | ICD-10-CM | POA: Diagnosis not present

## 2018-12-28 DIAGNOSIS — Z01812 Encounter for preprocedural laboratory examination: Secondary | ICD-10-CM | POA: Diagnosis present

## 2018-12-28 LAB — SARS CORONAVIRUS 2 (TAT 6-24 HRS): SARS Coronavirus 2: NEGATIVE

## 2018-12-28 NOTE — MAU Note (Signed)
Pt here for PAT covid swab, denies any symptoms. Swab collected 

## 2018-12-30 ENCOUNTER — Inpatient Hospital Stay (HOSPITAL_COMMUNITY): Payer: BC Managed Care – PPO

## 2018-12-30 ENCOUNTER — Other Ambulatory Visit: Payer: Self-pay

## 2018-12-30 ENCOUNTER — Inpatient Hospital Stay (HOSPITAL_COMMUNITY): Payer: BC Managed Care – PPO | Admitting: Anesthesiology

## 2018-12-30 ENCOUNTER — Inpatient Hospital Stay (HOSPITAL_COMMUNITY)
Admission: AD | Admit: 2018-12-30 | Discharge: 2019-01-01 | DRG: 785 | Disposition: A | Discharge status: Home / Self Care | Payer: BC Managed Care – PPO | Attending: Obstetrics | Admitting: Obstetrics

## 2018-12-30 ENCOUNTER — Encounter (HOSPITAL_COMMUNITY)
Admission: AD | Disposition: A | Discharge status: Home / Self Care | Payer: Self-pay | Source: Home / Self Care | Attending: Obstetrics

## 2018-12-30 ENCOUNTER — Encounter (HOSPITAL_COMMUNITY): Payer: Self-pay

## 2018-12-30 DIAGNOSIS — Z3A39 39 weeks gestation of pregnancy: Secondary | ICD-10-CM | POA: Diagnosis not present

## 2018-12-30 DIAGNOSIS — Z86711 Personal history of pulmonary embolism: Secondary | ICD-10-CM | POA: Diagnosis not present

## 2018-12-30 DIAGNOSIS — O26892 Other specified pregnancy related conditions, second trimester: Secondary | ICD-10-CM | POA: Diagnosis present

## 2018-12-30 DIAGNOSIS — Z302 Encounter for sterilization: Secondary | ICD-10-CM | POA: Diagnosis not present

## 2018-12-30 DIAGNOSIS — Z86718 Personal history of other venous thrombosis and embolism: Secondary | ICD-10-CM

## 2018-12-30 DIAGNOSIS — Z8751 Personal history of pre-term labor: Secondary | ICD-10-CM

## 2018-12-30 DIAGNOSIS — O34211 Maternal care for low transverse scar from previous cesarean delivery: Secondary | ICD-10-CM | POA: Diagnosis present

## 2018-12-30 LAB — BASIC METABOLIC PANEL
Anion gap: 11 (ref 5–15)
BUN: 11 mg/dL (ref 6–20)
CO2: 20 mmol/L — ABNORMAL LOW (ref 22–32)
Calcium: 8.8 mg/dL — ABNORMAL LOW (ref 8.9–10.3)
Chloride: 102 mmol/L (ref 98–111)
Creatinine, Ser: 0.54 mg/dL (ref 0.44–1.00)
GFR calc Af Amer: >60 mL/min (ref >60)
GFR calc non Af Amer: >60 mL/min (ref >60)
Glucose, Bld: 95 mg/dL (ref 70–99)
Potassium: 4.3 mmol/L (ref 3.5–5.1)
Sodium: 133 mmol/L — ABNORMAL LOW (ref 135–145)

## 2018-12-30 LAB — CBC
HCT: 37.7 % (ref 36.0–46.0)
Hemoglobin: 12.6 g/dL (ref 12.0–15.0)
MCH: 30.8 pg (ref 26.0–34.0)
MCHC: 33.4 g/dL (ref 30.0–36.0)
MCV: 92.2 fL (ref 80.0–100.0)
Platelets: 285 10*3/uL (ref 150–400)
RBC: 4.09 MIL/uL (ref 3.87–5.11)
RDW: 13.2 % (ref 11.5–15.5)
WBC: 14 10*3/uL — ABNORMAL HIGH (ref 4.0–10.5)
nRBC: 0 % (ref 0.0–0.2)

## 2018-12-30 LAB — TYPE AND SCREEN
ABO/RH(D): B POS
Antibody Screen: NEGATIVE

## 2018-12-30 LAB — RPR: RPR Ser Ql: NONREACTIVE

## 2018-12-30 LAB — ABO/RH: ABO/RH(D): B POS

## 2018-12-30 SURGERY — Surgical Case
Anesthesia: Epidural | Wound class: Clean Contaminated

## 2018-12-30 MED ORDER — MENTHOL 3 MG MT LOZG: 1.0000 | LOZENGE | OROMUCOSAL | Status: DC | PRN

## 2018-12-30 MED ORDER — FENTANYL-BUPIVACAINE-NACL 0.5-0.125-0.9 MG/250ML-% EP SOLN
12.0000 mL/h | EPIDURAL | Status: DC | PRN
  Filled 2018-12-30: qty 250

## 2018-12-30 MED ORDER — CEFAZOLIN SODIUM-DEXTROSE 2-4 GM/100ML-% IV SOLN
INTRAVENOUS | Status: AC
  Filled 2018-12-30: qty 100

## 2018-12-30 MED ORDER — OXYCODONE HCL 5 MG PO TABS
5.0000 mg | ORAL_TABLET | ORAL | Status: DC | PRN
  Administered 2019-01-01 (×2): 5 mg via ORAL
  Filled 2018-12-30 (×2): qty 1

## 2018-12-30 MED ORDER — OXYCODONE-ACETAMINOPHEN 5-325 MG PO TABS: 1.0000 | ORAL_TABLET | ORAL | Status: DC | PRN

## 2018-12-30 MED ORDER — PHENYLEPHRINE 40 MCG/ML (10ML) SYRINGE FOR IV PUSH (FOR BLOOD PRESSURE SUPPORT)
80.0000 ug | PREFILLED_SYRINGE | INTRAVENOUS | Status: DC | PRN
  Filled 2018-12-30: qty 10

## 2018-12-30 MED ORDER — MORPHINE SULFATE (PF) 0.5 MG/ML IJ SOLN
INTRAMUSCULAR | Status: AC
  Filled 2018-12-30: qty 10

## 2018-12-30 MED ORDER — DEXAMETHASONE SODIUM PHOSPHATE 4 MG/ML IJ SOLN
INTRAMUSCULAR | Status: AC
  Filled 2018-12-30: qty 1

## 2018-12-30 MED ORDER — DIPHENHYDRAMINE HCL 50 MG/ML IJ SOLN: 12.5000 mg | INTRAMUSCULAR | Status: DC | PRN

## 2018-12-30 MED ORDER — NALBUPHINE HCL 10 MG/ML IJ SOLN: 5.0000 mg | INTRAMUSCULAR | Status: DC | PRN

## 2018-12-30 MED ORDER — SODIUM CHLORIDE 0.9% FLUSH: 3.0000 mL | INTRAVENOUS | Status: DC | PRN

## 2018-12-30 MED ORDER — SCOPOLAMINE 1 MG/3DAYS TD PT72: 1.0000 | MEDICATED_PATCH | Freq: Once | TRANSDERMAL | Status: DC

## 2018-12-30 MED ORDER — ONDANSETRON HCL 4 MG/2ML IJ SOLN: 4.0000 mg | Freq: Three times a day (TID) | INTRAMUSCULAR | Status: DC | PRN

## 2018-12-30 MED ORDER — DEXAMETHASONE SODIUM PHOSPHATE 4 MG/ML IJ SOLN
INTRAMUSCULAR | Status: DC | PRN
  Administered 2018-12-30: 4 mg via INTRAVENOUS

## 2018-12-30 MED ORDER — PHENYLEPHRINE 40 MCG/ML (10ML) SYRINGE FOR IV PUSH (FOR BLOOD PRESSURE SUPPORT)
80.0000 ug | PREFILLED_SYRINGE | INTRAVENOUS | Status: DC | PRN
  Administered 2018-12-30 (×2): 80 ug via INTRAVENOUS

## 2018-12-30 MED ORDER — PRENATAL MULTIVITAMIN CH
1.0000 | ORAL_TABLET | Freq: Every day | ORAL | Status: DC
  Administered 2018-12-31 – 2019-01-01 (×2): 1 via ORAL
  Filled 2018-12-30 (×2): qty 1

## 2018-12-30 MED ORDER — LACTATED RINGERS IV SOLN
500.0000 mL | INTRAVENOUS | Status: DC | PRN
  Administered 2018-12-30 (×2): 500 mL via INTRAVENOUS

## 2018-12-30 MED ORDER — FENTANYL CITRATE (PF) 100 MCG/2ML IJ SOLN: 50.0000 ug | INTRAMUSCULAR | Status: DC | PRN

## 2018-12-30 MED ORDER — SIMETHICONE 80 MG PO CHEW
80.0000 mg | CHEWABLE_TABLET | Freq: Three times a day (TID) | ORAL | Status: DC
  Administered 2018-12-31 – 2019-01-01 (×6): 80 mg via ORAL
  Filled 2018-12-30 (×6): qty 1

## 2018-12-30 MED ORDER — SOD CITRATE-CITRIC ACID 500-334 MG/5ML PO SOLN
30.0000 mL | ORAL | Status: DC | PRN
  Administered 2018-12-30: 30 mL via ORAL
  Filled 2018-12-30: qty 30

## 2018-12-30 MED ORDER — NALOXONE HCL 0.4 MG/ML IJ SOLN: 0.4000 mg | INTRAMUSCULAR | Status: DC | PRN

## 2018-12-30 MED ORDER — LACTATED RINGERS IV SOLN
INTRAVENOUS | Status: DC | PRN
  Administered 2018-12-30 (×2): via INTRAVENOUS

## 2018-12-30 MED ORDER — SIMETHICONE 80 MG PO CHEW
80.0000 mg | CHEWABLE_TABLET | ORAL | Status: DC
  Administered 2018-12-30 – 2019-01-01 (×2): 80 mg via ORAL
  Filled 2018-12-30 (×2): qty 1

## 2018-12-30 MED ORDER — OXYCODONE-ACETAMINOPHEN 5-325 MG PO TABS: 2.0000 | ORAL_TABLET | ORAL | Status: DC | PRN

## 2018-12-30 MED ORDER — DIBUCAINE (PERIANAL) 1 % EX OINT: 1.0000 "application " | TOPICAL_OINTMENT | CUTANEOUS | Status: DC | PRN

## 2018-12-30 MED ORDER — PHENYLEPHRINE HCL (PRESSORS) 10 MG/ML IV SOLN
INTRAVENOUS | Status: DC | PRN
  Administered 2018-12-30 (×3): 80 ug via INTRAVENOUS

## 2018-12-30 MED ORDER — OXYTOCIN 10 UNIT/ML IJ SOLN
INTRAMUSCULAR | Status: DC | PRN
  Administered 2018-12-30: 40 [IU] via INTRAMUSCULAR

## 2018-12-30 MED ORDER — LACTATED RINGERS IV SOLN
INTRAVENOUS | Status: DC
  Administered 2018-12-30: 07:00:00 via INTRAVENOUS

## 2018-12-30 MED ORDER — LACTATED RINGERS IV SOLN
500.0000 mL | Freq: Once | INTRAVENOUS | Status: AC
  Administered 2018-12-30: 500 mL via INTRAVENOUS

## 2018-12-30 MED ORDER — SODIUM CHLORIDE (PF) 0.9 % IJ SOLN
INTRAMUSCULAR | Status: DC | PRN
  Administered 2018-12-30: 12 mL/h via EPIDURAL

## 2018-12-30 MED ORDER — TERBUTALINE SULFATE 1 MG/ML IJ SOLN: 0.2500 mg | Freq: Once | INTRAMUSCULAR | Status: DC | PRN

## 2018-12-30 MED ORDER — LIDOCAINE HCL (PF) 1 % IJ SOLN: 30.0000 mL | INTRAMUSCULAR | Status: DC | PRN

## 2018-12-30 MED ORDER — SODIUM CHLORIDE 0.9 % IV SOLN
INTRAVENOUS | Status: DC | PRN
  Administered 2018-12-30: 19:00:00 via INTRAVENOUS

## 2018-12-30 MED ORDER — ENOXAPARIN SODIUM 40 MG/0.4ML ~~LOC~~ SOLN: 40.0000 mg | SUBCUTANEOUS | Status: DC

## 2018-12-30 MED ORDER — WITCH HAZEL-GLYCERIN EX PADS: 1.0000 "application " | MEDICATED_PAD | CUTANEOUS | Status: DC | PRN

## 2018-12-30 MED ORDER — IBUPROFEN 800 MG PO TABS
800.0000 mg | ORAL_TABLET | Freq: Three times a day (TID) | ORAL | Status: DC
  Administered 2018-12-30 – 2019-01-01 (×6): 800 mg via ORAL
  Filled 2018-12-30 (×6): qty 1

## 2018-12-30 MED ORDER — SIMETHICONE 80 MG PO CHEW: 80.0000 mg | CHEWABLE_TABLET | ORAL | Status: DC | PRN

## 2018-12-30 MED ORDER — ACETAMINOPHEN 500 MG PO TABS
1000.0000 mg | ORAL_TABLET | Freq: Four times a day (QID) | ORAL | Status: DC
  Administered 2018-12-30 – 2019-01-01 (×8): 1000 mg via ORAL
  Filled 2018-12-30 (×8): qty 2

## 2018-12-30 MED ORDER — CEFAZOLIN SODIUM-DEXTROSE 2-4 GM/100ML-% IV SOLN
2.0000 g | Freq: Once | INTRAVENOUS | Status: AC
  Administered 2018-12-30: 2 g via INTRAVENOUS

## 2018-12-30 MED ORDER — DIPHENHYDRAMINE HCL 25 MG PO CAPS
25.0000 mg | ORAL_CAPSULE | ORAL | Status: DC | PRN
  Administered 2018-12-30 – 2018-12-31 (×2): 25 mg via ORAL
  Filled 2018-12-30 (×2): qty 1

## 2018-12-30 MED ORDER — COCONUT OIL OIL
1.0000 "application " | TOPICAL_OIL | Status: DC | PRN
  Administered 2019-01-01: 1 via TOPICAL

## 2018-12-30 MED ORDER — ONDANSETRON HCL 4 MG/2ML IJ SOLN
INTRAMUSCULAR | Status: AC
  Filled 2018-12-30: qty 2

## 2018-12-30 MED ORDER — NALBUPHINE HCL 10 MG/ML IJ SOLN
5.0000 mg | Freq: Once | INTRAMUSCULAR | Status: DC | PRN
  Filled 2018-12-30: qty 1

## 2018-12-30 MED ORDER — MORPHINE SULFATE (PF) 0.5 MG/ML IJ SOLN
INTRAMUSCULAR | Status: DC | PRN
  Administered 2018-12-30: 3 mg via EPIDURAL

## 2018-12-30 MED ORDER — OXYTOCIN 40 UNITS IN NORMAL SALINE INFUSION - SIMPLE MED
INTRAVENOUS | Status: AC
  Filled 2018-12-30: qty 1000

## 2018-12-30 MED ORDER — SENNOSIDES-DOCUSATE SODIUM 8.6-50 MG PO TABS
2.0000 | ORAL_TABLET | ORAL | Status: DC
  Administered 2018-12-30 – 2019-01-01 (×2): 2 via ORAL
  Filled 2018-12-30 (×2): qty 2

## 2018-12-30 MED ORDER — NALOXONE HCL 4 MG/10ML IJ SOLN
1.0000 ug/kg/h | INTRAVENOUS | Status: DC | PRN
  Filled 2018-12-30: qty 5

## 2018-12-30 MED ORDER — ACETAMINOPHEN 325 MG PO TABS: 650.0000 mg | ORAL_TABLET | ORAL | Status: DC | PRN

## 2018-12-30 MED ORDER — EPHEDRINE 5 MG/ML INJ: 10.0000 mg | INTRAVENOUS | Status: DC | PRN

## 2018-12-30 MED ORDER — LIDOCAINE-EPINEPHRINE (PF) 2 %-1:200000 IJ SOLN
INTRAMUSCULAR | Status: AC
  Filled 2018-12-30: qty 20

## 2018-12-30 MED ORDER — ACETAMINOPHEN 500 MG PO TABS: 1000.0000 mg | ORAL_TABLET | Freq: Four times a day (QID) | ORAL | Status: DC

## 2018-12-30 MED ORDER — OXYTOCIN BOLUS FROM INFUSION: 500.0000 mL | Freq: Once | INTRAVENOUS | Status: DC

## 2018-12-30 MED ORDER — ACETAMINOPHEN 10 MG/ML IV SOLN: 1000.0000 mg | Freq: Once | INTRAVENOUS | Status: DC | PRN

## 2018-12-30 MED ORDER — ONDANSETRON HCL 4 MG/2ML IJ SOLN
INTRAMUSCULAR | Status: DC | PRN
  Administered 2018-12-30: 4 mg via INTRAVENOUS

## 2018-12-30 MED ORDER — DIPHENHYDRAMINE HCL 25 MG PO CAPS
25.0000 mg | ORAL_CAPSULE | Freq: Four times a day (QID) | ORAL | Status: DC | PRN
  Administered 2018-12-31: 25 mg via ORAL
  Filled 2018-12-30: qty 1

## 2018-12-30 MED ORDER — FENTANYL CITRATE (PF) 100 MCG/2ML IJ SOLN: 25.0000 ug | INTRAMUSCULAR | Status: DC | PRN

## 2018-12-30 MED ORDER — LACTATED RINGERS IV SOLN
INTRAVENOUS | Status: DC
  Administered 2018-12-31: 01:00:00 via INTRAVENOUS

## 2018-12-30 MED ORDER — ONDANSETRON HCL 4 MG/2ML IJ SOLN: 4.0000 mg | Freq: Four times a day (QID) | INTRAMUSCULAR | Status: DC | PRN

## 2018-12-30 MED ORDER — NALBUPHINE HCL 10 MG/ML IJ SOLN
5.0000 mg | INTRAMUSCULAR | Status: DC | PRN
  Administered 2018-12-31 (×2): 5 mg via INTRAVENOUS
  Filled 2018-12-30: qty 1

## 2018-12-30 MED ORDER — OXYTOCIN 40 UNITS IN NORMAL SALINE INFUSION - SIMPLE MED: 2.5000 [IU]/h | INTRAVENOUS | Status: AC

## 2018-12-30 MED ORDER — OXYTOCIN 40 UNITS IN NORMAL SALINE INFUSION - SIMPLE MED: 2.5000 [IU]/h | INTRAVENOUS | Status: DC

## 2018-12-30 MED ORDER — MEPERIDINE HCL 25 MG/ML IJ SOLN
INTRAMUSCULAR | Status: AC
  Filled 2018-12-30: qty 1

## 2018-12-30 MED ORDER — PHENYLEPHRINE 40 MCG/ML (10ML) SYRINGE FOR IV PUSH (FOR BLOOD PRESSURE SUPPORT)
PREFILLED_SYRINGE | INTRAVENOUS | Status: AC
  Filled 2018-12-30: qty 10

## 2018-12-30 MED ORDER — LIDOCAINE HCL (PF) 1 % IJ SOLN
INTRAMUSCULAR | Status: DC | PRN
  Administered 2018-12-30: 11 mL via EPIDURAL

## 2018-12-30 MED ORDER — LIDOCAINE-EPINEPHRINE (PF) 2 %-1:200000 IJ SOLN
INTRAMUSCULAR | Status: DC | PRN
  Administered 2018-12-30: 5 mL via EPIDURAL
  Administered 2018-12-30 (×2): 3 mL via EPIDURAL
  Administered 2018-12-30: 5 mL via EPIDURAL

## 2018-12-30 MED ORDER — MEPERIDINE HCL 25 MG/ML IJ SOLN
INTRAMUSCULAR | Status: DC | PRN
  Administered 2018-12-30: 12.5 mg via INTRAVENOUS

## 2018-12-30 MED ORDER — NALBUPHINE HCL 10 MG/ML IJ SOLN: 5.0000 mg | Freq: Once | INTRAMUSCULAR | Status: DC | PRN

## 2018-12-30 MED ORDER — OXYTOCIN 40 UNITS IN NORMAL SALINE INFUSION - SIMPLE MED
1.0000 m[IU]/min | INTRAVENOUS | Status: DC
  Administered 2018-12-30: 08:00:00 2 m[IU]/min via INTRAVENOUS
  Filled 2018-12-30 (×2): qty 1000

## 2018-12-30 SURGICAL SUPPLY — 38 items
APL SKNCLS STERI-STRIP NONHPOA (GAUZE/BANDAGES/DRESSINGS) ×1
BENZOIN TINCTURE PRP APPL 2/3 (GAUZE/BANDAGES/DRESSINGS) ×3 IMPLANT
CHLORAPREP W/TINT 26ML (MISCELLANEOUS) ×3 IMPLANT
CLAMP CORD UMBIL (MISCELLANEOUS) IMPLANT
CLOSURE WOUND 1/2 X4 (GAUZE/BANDAGES/DRESSINGS) ×1
CLOTH BEACON ORANGE TIMEOUT ST (SAFETY) ×3 IMPLANT
DRSG OPSITE POSTOP 4X10 (GAUZE/BANDAGES/DRESSINGS) ×3 IMPLANT
ELECT REM PT RETURN 9FT ADLT (ELECTROSURGICAL) ×3
ELECTRODE REM PT RTRN 9FT ADLT (ELECTROSURGICAL) ×1 IMPLANT
EXTRACTOR VACUUM KIWI (MISCELLANEOUS) IMPLANT
GLOVE BIOGEL PI IND STRL 6.5 (GLOVE) ×1 IMPLANT
GLOVE BIOGEL PI IND STRL 7.0 (GLOVE) ×1 IMPLANT
GLOVE BIOGEL PI INDICATOR 6.5 (GLOVE) ×2
GLOVE BIOGEL PI INDICATOR 7.0 (GLOVE) ×2
GLOVE ECLIPSE 6.0 STRL STRAW (GLOVE) ×3 IMPLANT
GOWN STRL REUS W/TWL LRG LVL3 (GOWN DISPOSABLE) ×6 IMPLANT
KIT ABG SYR 3ML LUER SLIP (SYRINGE) IMPLANT
NDL HYPO 25X5/8 SAFETYGLIDE (NEEDLE) IMPLANT
NEEDLE HYPO 25X5/8 SAFETYGLIDE (NEEDLE) IMPLANT
NS IRRIG 1000ML POUR BTL (IV SOLUTION) ×3 IMPLANT
PACK C SECTION WH (CUSTOM PROCEDURE TRAY) ×3 IMPLANT
PAD OB MATERNITY 4.3X12.25 (PERSONAL CARE ITEMS) ×3 IMPLANT
PENCIL SMOKE EVAC W/HOLSTER (ELECTROSURGICAL) ×3 IMPLANT
RTRCTR C-SECT PINK 25CM LRG (MISCELLANEOUS) ×3 IMPLANT
STRIP CLOSURE SKIN 1/2X4 (GAUZE/BANDAGES/DRESSINGS) ×2 IMPLANT
SUT MNCRL 0 VIOLET CTX 36 (SUTURE) ×2 IMPLANT
SUT MNCRL AB 3-0 PS2 27 (SUTURE) ×3 IMPLANT
SUT MONOCRYL 0 CTX 36 (SUTURE) ×4
SUT PLAIN 0 NONE (SUTURE) IMPLANT
SUT PLAIN 2 0 (SUTURE) ×3
SUT PLAIN ABS 2-0 CT1 27XMFL (SUTURE) ×1 IMPLANT
SUT VIC AB 0 CTX 36 (SUTURE) ×6
SUT VIC AB 0 CTX36XBRD ANBCTRL (SUTURE) ×2 IMPLANT
SUT VIC AB 2-0 CT1 27 (SUTURE) ×3
SUT VIC AB 2-0 CT1 TAPERPNT 27 (SUTURE) ×1 IMPLANT
TOWEL OR 17X24 6PK STRL BLUE (TOWEL DISPOSABLE) ×3 IMPLANT
TRAY FOLEY W/BAG SLVR 14FR LF (SET/KITS/TRAYS/PACK) ×3 IMPLANT
WATER STERILE IRR 1000ML POUR (IV SOLUTION) ×3 IMPLANT

## 2018-12-30 NOTE — Anesthesia Procedure Notes (Signed)
Epidural Patient location during procedure: OB Start time: 12/30/2018 2:17 PM End time: 12/30/2018 2:33 PM  Staffing Anesthesiologist: Lynda Rainwater, MD Performed: anesthesiologist   Preanesthetic Checklist Completed: patient identified, site marked, surgical consent, pre-op evaluation, timeout performed, IV checked, risks and benefits discussed and monitors and equipment checked  Epidural Patient position: sitting Prep: ChloraPrep Patient monitoring: heart rate, cardiac monitor, continuous pulse ox and blood pressure Approach: midline Location: L2-L3 Injection technique: LOR saline  Needle:  Needle type: Tuohy  Needle gauge: 17 G Needle length: 9 cm Needle insertion depth: 5 cm Catheter type: closed end flexible Catheter size: 20 Guage Catheter at skin depth: 9 cm Test dose: negative  Assessment Events: blood not aspirated, injection not painful, no injection resistance, negative IV test and no paresthesia  Additional Notes Reason for block:procedure for pain

## 2018-12-30 NOTE — Progress Notes (Signed)
Discussed with pt. The importance and benefits of getting up out of the bed. Pt. C/o feeling light headed and wanted to wait a little longer before getting up. Pt. Tolerating fluids, encouraged to continue to drinking fluids, will continue to monitor and encourage getting out of bed.   Lewanda Rife, RN

## 2018-12-30 NOTE — Lactation Note (Signed)
This note was copied from a baby's chart. Lactation Consultation Note  Patient Name: Stephanie Hicks HENID'P Date: 12/30/2018 Reason for consult: Initial assessment;Term;Other (Comment)(LGA infant C/S delivery) P2, 3 hour female infant, LGA mom with C/S delivery.  Per mom, she feels breastfeeding is going well infant latched 3 times since birth and breastfeed for 45 minutes in L&D. Mom did not latch her premature son born at ([redacted] weeks gestation)  to breast who is now 33 years old she gave him pumped breast milk for 3 months.  Mom has pseudo- inverted nipple on left breast only but infant has been latching  well to breast, mom doesn't need breast shells nor NS at this time. Mom taught back hand expression and easily expressed colostrum, infant was given 5 ml of colostrum by spoon.  Mom had finished breastfeeding prior as  LC walked in  room but infant was still cuing to breastfed, mom latched infant on left breast with nipple (inversion) infant latched without difficulty.  Infant breastfeeding total of 33 minutes as LC left the room. Mom knows to breastfeed infant according hunger cues, 8 to 12 times within 24 hours and on demand. Parents will continue to do STS.  Mom knows to call Nurse or [place] if she has any questions, concerns or need assistance with latching infant to breast.  Reviewed Baby & Me book's Breastfeeding Basics.  Mom made aware of O/P services, breastfeeding support groups, community resources, and our phone # for post-discharge questions.    Maternal Data Formula Feeding for Exclusion: No Has patient been taught Hand Expression?: Yes(Infant given 5 ml of colostrum by spoon.)  Feeding Feeding Type: Breast Fed  LATCH Score Latch: Grasps breast easily, tongue down, lips flanged, rhythmical sucking.  Audible Swallowing: Spontaneous and intermittent  Type of Nipple: Everted at rest and after stimulation  Comfort (Breast/Nipple): Soft / non-tender  Hold (Positioning):  Assistance needed to correctly position infant at breast and maintain latch.  LATCH Score: 9  Interventions Interventions: Breast feeding basics reviewed;Breast compression;Assisted with latch;Adjust position;Support pillows;Skin to skin;Breast massage;Position options;Hand express;Expressed milk  Lactation Tools Discussed/Used WIC Program: No   Consult Status Consult Status: Follow-up Date: 12/31/18 Follow-up type: In-patient    Vicente Serene 12/30/2018, 10:49 PM

## 2018-12-30 NOTE — Progress Notes (Signed)
Patient pushing well with good progress.  However, with pushing, fetal heart rate now in the 190s to 200s.  In addition, patient with pain at right aspect of incision.  Variability and FHR normal between contractions.  Baby is persistent OP and I anticipate we have at least an additional 30+ minutes of pushing. However, in setting of prior cesarean section, I believe delivery needs to be expedited.  Patient declines attempt at vacuum assisted vaginal delivery, so I recommend proceeding to the operating room for fetal intolerance of labor.  She desires bilateral tubal ligation (discussed during antepartum period).  We discussed the risks to cesarean section to include infection, bleeding, damage to surrounding structures (including but not limited to bowel, bladder, tubes, ovaries, nerves, vessels, baby), need for blood transfusion, venous thromboembolism, failure of tubal ligation, including ectopic pregnancy, risk of regrets, need for additional procedures.

## 2018-12-30 NOTE — Anesthesia Postprocedure Evaluation (Addendum)
Anesthesia Post Note  Patient: Stephanie Hicks  Procedure(s) Performed: CESAREAN SECTION WITH BILATERAL TUBAL LIGATION (N/A )     Patient location during evaluation: PACU Anesthesia Type: Epidural Level of consciousness: awake and alert Pain management: pain level controlled Vital Signs Assessment: post-procedure vital signs reviewed and stable Respiratory status: spontaneous breathing, nonlabored ventilation, respiratory function stable and patient connected to nasal cannula oxygen Cardiovascular status: blood pressure returned to baseline and stable Postop Assessment: no apparent nausea or vomiting, no headache and epidural receding Anesthetic complications: no    Last Vitals:  Vitals:   12/30/18 2120 12/30/18 2217  BP: (!) 110/53 115/72  Pulse: 74 77  Resp: 18 18  Temp: 37.1 C 36.8 C  SpO2: 96% 97%    Last Pain:  Vitals:   12/30/18 2220  TempSrc:   PainSc: 3    Pain Goal: Patients Stated Pain Goal: 5 (12/30/18 2100)              Epidural/Spinal Function Cutaneous sensation: Pins and Needles (12/30/18 2220), Patient able to flex knees: Yes (12/30/18 2220), Patient able to lift hips off bed: Yes (12/30/18 2220), Back pain beyond tenderness at insertion site: No (12/30/18 2220), Progressively worsening motor and/or sensory loss: No (12/30/18 2220), Bowel and/or bladder incontinence post epidural: No (12/30/18 2220)  [place]

## 2018-12-30 NOTE — Op Note (Signed)
Cesarean Section Procedure Note  Pre-operative Diagnosis: 1. Intrauterine pregnancy at 10539w1d  2. History of cesarean section  3.  Failed trial of labor.  4.  Fetal intolerance of labor  5.  Desires permanent sterilization  Post-operative Diagnosis: same as above  Surgeon: Marlow Baarsyanna Jaimy Kliethermes, MD  Procedure: Repeat low transverse cesarean section with parkland bilateral tubal ligation  Anesthesia: Epidural anesthesia  Estimated Blood Loss: 428 mL         Drains: Foley catheter         Specimens: portion of bilateral fallopian tubes to pathology               Complications:  None; patient tolerated the procedure well.         Disposition: PACU - hemodynamically stable.  Findings:  Normal uterus, tubes and ovaries bilaterally.  Viable female infant, 9 lb 13.5 oz (4465 g) Apgars 8, 9.    Procedure Details   The patient was counseled about the risks, benefits, complications of the cesarean section as well as the permanent nature of a tubal ligation.  She elected to proceed with a tubal ligation. Consent was obtained.    After epidural anesthesia was found to adequate , the patient was placed in the dorsal supine position with a leftward tilt, prepped and draped in the usual sterile manner. A Pfannenstiel incision was made and carried down through the subcutaneous tissue to the fascia.  The fascia was incised in the midline and the fascial incision was extended laterally with Mayo scissors. The superior aspect of the fascial incision was grasped with two Kocher clamp, tented up and the rectus muscles dissected off bluntly. The rectus was then dissected off with blunt dissection and Mayo scissors inferiorly. The rectus muscles were separated in the midline. The abdominal peritoneum was identified, tented up, entered sharply, and the incision was extended superiorly and inferiorly with good visualization of the bladder. The vesicouterine peritoneum was identified, tented up, entered bluntly.  The Alexis  retractor was deployed and the bladder flap was created digitally. Scalpel was then used to make a low transverse incision on the uterus which was extended laterally with blunt dissection. The fluid was clear. The fetal vertex was identified and brought to the hysterotomy and delivered easily.  The cord was clamped and cut and the infant was passed to the waiting neonatologist.  Placenta was then delivered spontaneously, intact and appear normal, the uterus was cleared of all clot and debris.   The hysterotomy was repaired with #0 Monocryl in running locked fashion.  A second imbricating layer was placed.  At this time, attention was turned to the left fallopian tube.  The tube was grasped with two babcock clamps.  An avascular segment of the mesosalpinx was identified, and a parkland tubal ligation was performed in the usual fashion with #0 plain gut suture.  Each tube was doubly tied. Tubal ostia were identified on both remaining sides of the tube.  This was repeated with the right fallopian tube. The portion of the left and right fallopian tubes were sent to pathology.   The serosal edges of the incision were oozy, and bovie cautery was used to achieve hemostasis.  The hysterotomy was reexamined and excellent hemostasis was noted.  The abdominal cavity was cleared of all clot and debris.  The Alexis retractor was removed from the abdomen. The fascia and rectus muscles were inspected and were hemostatic. The peritoneum was closed with 2-0 vicryl.  The fascia was closed with 0  Vicryl in a running fashion. The subcuticular layer was irrigated and all bleeders cauterized.  The subcutaneous layer was re approximated with interrupted 3-0 plain gut.  The skin was closed with 3-0 monocryl in a subcuticular fashion. The incision was dressed with benzoine, steri strips and pressure dressing. All sponge lap and needle counts were correct x3. Patient tolerated the procedure well and recovered in stable condition following  the procedure.

## 2018-12-30 NOTE — Anesthesia Preprocedure Evaluation (Signed)
Anesthesia Evaluation  Patient identified by MRN, date of birth, ID band Patient awake    Reviewed: Allergy & Precautions, H&P , NPO status , Patient's Chart, lab work & pertinent test results  Airway Mallampati: I  TM Distance: >3 FB Neck ROM: full    Dental no notable dental hx.    Pulmonary neg pulmonary ROS,    Pulmonary exam normal breath sounds clear to auscultation       Cardiovascular negative cardio ROS Normal cardiovascular exam Rhythm:Regular Rate:Normal     Neuro/Psych negative neurological ROS  negative psych ROS   GI/Hepatic negative GI ROS, Neg liver ROS,   Endo/Other  negative endocrine ROS  Renal/GU negative Renal ROS     Musculoskeletal   Abdominal Normal abdominal exam  (+)   Peds  Hematology negative hematology ROS (+)   Anesthesia Other Findings   Reproductive/Obstetrics (+) Pregnancy                             Anesthesia Physical  Anesthesia Plan  ASA: II  Anesthesia Plan: Epidural   Post-op Pain Management:    Induction:   PONV Risk Score and Plan:   Airway Management Planned:   Additional Equipment:   Intra-op Plan:   Post-operative Plan:   Informed Consent: I have reviewed the patients History and Physical, chart, labs and discussed the procedure including the risks, benefits and alternatives for the proposed anesthesia with the patient or authorized representative who has indicated his/her understanding and acceptance.       Plan Discussed with:   Anesthesia Plan Comments:         Anesthesia Quick Evaluation

## 2018-12-30 NOTE — Progress Notes (Signed)
Mild cramping  BP 123/74   Pulse 92   Temp 98.4 F (36.9 C) (Oral)   Resp 18   Ht 5\' 6"  (1.676 m)   Wt 88 kg   BMI 31.31 kg/m   NAD Abd: soft, gravid Toco: q2-3 minutes EFM: 130s, moderate variability, + accels, category 1 SVE: 5/90/0, AROM large clear fluid  A/P:  G2P0101 @ [redacted]w[redacted]d w IOL, TOLAC Continue low dose pitocin Entering active phase Doing well, fetal status reassuring

## 2018-12-30 NOTE — H&P (Signed)
33 y.o. G2P0101 @ [redacted]w[redacted]d presents for induction of labor.  Otherwise has good fetal movement and no bleeding.  1.  History of pulmonary embolism in 2006.    Has been on lovenox this pregnancy.  She reports that she took her last dose of Lovenox 2 days ago.  She did not take it yesterday AM 2. History of cesarean section:  Due to placental abruption in setting of PPROM.  Desires TOLAC 3. History of preterm delivery:  With G1 at 31 weeks.  Has been on 17-P this pregnancy  Past Medical History:  Diagnosis Date  . Anxiety   . Depression   . Endometriosis   . Headache    migraines  . Pulmonary embolism ([place]) 2006   related to birth control pills    Past Surgical History:  Procedure Laterality Date  . CESAREAN SECTION N/A 12/01/2015   Procedure: CESAREAN SECTION;  Surgeon: Bobbye Charleston, MD;  Location: [facility];  Service: Obstetrics;  Laterality: N/A;  . COLPOSCOPY    . DRUG INDUCED ENDOSCOPY    . gyn laparoscopy    . KNEE ARTHROSCOPY  2003   right knee  . TONSILLECTOMY  2008    OB History  Gravida Para Term Preterm AB Living  2 1   1   1   SAB TAB Ectopic Multiple Live Births        0 1    # Outcome Date GA Lbr Len/2nd Weight Sex Delivery Anes PTL Lv  2 Current           1 Preterm 12/01/15 [redacted]w[redacted]d  1.76 kg M CS-LTranv Spinal  LIV    Social History   Socioeconomic History  . Marital status: Married    Spouse name: Not on file  . Number of children: Not on file  . Years of education: Not on file  . Highest education level: Not on file  Occupational History  . Not on file  Social Needs  . Financial resource strain: Not hard at all  . Food insecurity    Worry: Never true    Inability: Never true  . Transportation needs    Medical: No    Non-medical: No  Tobacco Use  . Smoking status: Never Smoker  . Smokeless tobacco: Never Used  Substance and Sexual Activity  . Alcohol use: Yes    Comment: rarely  . Drug use: No  . Sexual activity: Yes   Adhesive [tape],  Dilaudid [hydromorphone], Gluten meal, and Naproxen    Prenatal Transfer Tool  Maternal Diabetes: No Genetic Screening: Declined Maternal Ultrasounds/Referrals: Normal Fetal Ultrasounds or other Referrals:  None Maternal Substance Abuse:  No Significant Maternal Medications:  Meds include: Other: lovenox, 17-P Significant Maternal Lab Results: Group B Strep negative  ABO, Rh: --/--/B POS (08/25 0654) Antibody: NEG (08/25 0654) Rubella: Immune (01/13 0000) RPR: Nonreactive (01/13 0000)  HBsAg: Negative (01/13 0000)  HIV: Non-reactive (01/13 0000)  GBS: Negative (08/03 0000)     Vitals:   12/30/18 0657  BP: 126/88  Pulse: (!) 102  Temp: 98.4 F (36.9 C)     General:  NAD Abdomen:  soft, gravid, EFW 8# Ex:  no edema SVE:  2-3/50/-2 per RN FHTs:  Baseline 155, moderate variability, + accels, no decels Toco:  Uterine irritibility   A/P   33 y.o. G2P0101 [redacted]w[redacted]d presents for planned induction of labor in setting of anti-coagulation due to history of pulmonary embolism History of cesarean section: have discussed r/b/a, including TOLAC, scheduled rcs  vs failed TOLAC.Marland Kitchen.  Specifically, we have discussed the small but present risk of uterine rupture and resultant maternal / fetal morbidity/mortality.  She wishes to proceed.  If she requires a repeat cesarean section, she desires a BTL.  We discussed risks of BTL in detail IOL with pitocin--cervix favorable Declines epidural--discussed risk / possible need of general anesthesia of urgent cesarean delivery FSR/ vtx/ GBS neg  Jewell Haught GEFFEL Havier Deeb

## 2018-12-30 NOTE — Transfer of Care (Signed)
Immediate Anesthesia Transfer of Care Note  Patient: Stephanie Hicks  Procedure(s) Performed: CESAREAN SECTION (N/A )  Patient Location: PACU  Anesthesia Type:Epidural  Level of Consciousness: awake, alert  and oriented  Airway & Oxygen Therapy: Patient Spontanous Breathing  Post-op Assessment: Report given to RN and Post -op Vital signs reviewed and stable  Post vital signs: Reviewed and stable  Last Vitals:  Vitals Value Taken Time  BP 95/67 12/30/18 2000  Temp 37 C 12/30/18 2000  Pulse 87 12/30/18 2007  Resp 14 12/30/18 2007  SpO2 97 % 12/30/18 2007  Vitals shown include unvalidated device data.  Last Pain:  Vitals:   12/30/18 2000  TempSrc: Oral  PainSc:       Patients Stated Pain Goal: 7 (37/10/62 6948)  Complications: No apparent anesthesia complications

## 2018-12-31 ENCOUNTER — Encounter (HOSPITAL_COMMUNITY): Payer: Self-pay | Admitting: Obstetrics

## 2018-12-31 LAB — CBC
HCT: 31.2 % — ABNORMAL LOW (ref 36.0–46.0)
Hemoglobin: 10.3 g/dL — ABNORMAL LOW (ref 12.0–15.0)
MCH: 31.1 pg (ref 26.0–34.0)
MCHC: 33 g/dL (ref 30.0–36.0)
MCV: 94.3 fL (ref 80.0–100.0)
Platelets: 196 10*3/uL (ref 150–400)
RBC: 3.31 MIL/uL — ABNORMAL LOW (ref 3.87–5.11)
RDW: 13.3 % (ref 11.5–15.5)
WBC: 23.8 10*3/uL — ABNORMAL HIGH (ref 4.0–10.5)
nRBC: 0 % (ref 0.0–0.2)

## 2018-12-31 LAB — CREATININE, SERUM
Creatinine, Ser: 0.71 mg/dL (ref 0.44–1.00)
GFR calc Af Amer: >60 mL/min (ref >60)
GFR calc non Af Amer: >60 mL/min (ref >60)

## 2018-12-31 MED ORDER — ENOXAPARIN SODIUM 40 MG/0.4ML ~~LOC~~ SOLN
40.0000 mg | SUBCUTANEOUS | Status: DC
  Administered 2018-12-31 – 2019-01-01 (×2): 40 mg via SUBCUTANEOUS
  Filled 2018-12-31 (×2): qty 0.4

## 2018-12-31 NOTE — Progress Notes (Signed)
MOB was referred for history of depression/anxiety.  * Referral screened out by Clinical Social Worker because none of the following criteria appear to apply:  ~ History of anxiety/depression during this pregnancy, or of post-partum depression following prior delivery. ~ Diagnosis of anxiety and/or depression within last 3 years. Per chart review, MOB diagnosed with anxiety/depression while she was in college. No concerns noted in [facility] records. OR * MOB's symptoms currently being treated with medication and/or therapy.  Please contact the Clinical Social Worker if needs arise, by [facility] request, or if MOB scores greater than 9/yes to question 10 on Edinburgh Postpartum Depression Screen.  Elijio Miles, [place]  [facility] and [employer] [phone]

## 2018-12-31 NOTE — Progress Notes (Signed)
After reviewing OP note and pt's labs, I feel that she should receive her first dose of lovenox at 0800- about 12 hours post op- rather than wait until 1500.  Pt is stable and I d/w her the importance of keeping scds on in bed.

## 2018-12-31 NOTE — Addendum Note (Signed)
Addendum  created 12/31/18 0802 by Ignacia Bayley, CRNA   Clinical Note Signed

## 2018-12-31 NOTE — Progress Notes (Signed)
Orthostatic vital signs completed, pt able to stand and ambulate in room with stand by assist. Tolerated well.   Lewanda Rife, RN

## 2018-12-31 NOTE — Anesthesia Postprocedure Evaluation (Signed)
Anesthesia Post Note  Patient: Stephanie Hicks  Procedure(s) Performed: CESAREAN SECTION WITH BILATERAL TUBAL LIGATION (N/A )     Anesthesia Post Evaluation  Last Vitals:  Vitals:   12/31/18 0138 12/31/18 0518  BP: 105/62 103/69  Pulse: (!) 57 71  Resp: 18 17  Temp: 36.6 C 36.6 C  SpO2: 97% 100%    Last Pain:  Vitals:   12/31/18 0518  TempSrc: Oral  PainSc: 2    Pain Goal: Patients Stated Pain Goal: 5 (12/30/18 2100)                 Barkley Boards

## 2018-12-31 NOTE — Progress Notes (Signed)
  Patient is eating, ambulating, voiding.  Pain control is good.  Vitals:   12/30/18 2341 12/31/18 0037 12/31/18 0138 12/31/18 0518  BP: 103/63  105/62 103/69  Pulse: 72  (!) 57 71  Resp: 18 18 18 17   Temp: 97.8 F (36.6 C) 98.1 F (36.7 C) 97.9 F (36.6 C) 97.9 F (36.6 C)  TempSrc: Axillary Axillary Oral Oral  SpO2: 98% 98% 97% 100%  Weight:      Height:        lungs:   clear to auscultation cor:    RRR Abdomen:  soft, appropriate tenderness, incisions intact and without erythema or exudate ex:    no cords   Lab Results  Component Value Date   WBC 14.0 (H) 12/30/2018   HGB 12.6 12/30/2018   HCT 37.7 12/30/2018   MCV 92.2 12/30/2018   PLT 285 12/30/2018    --/--/B POS, B POS Performed at [facility] [address] [place], [place] [address]  (08/25 0654)/[place]  A/P    Post operative day 1.  Routine post op and postpartum care.  Expect d/c routine. Pt has hx of pulmonary embolism in 2006- pt had SCDs and will get first dose of Lovenox postop at 1500 today.   Percocet for pain control.

## 2019-01-01 NOTE — Progress Notes (Signed)
Subjective: Postpartum Day 2: Cesarean Delivery Patient reports pain controlled, no nausea or vomiting, Ambulating without difficulty, breastfeeding  Objective: Vital signs in last 24 hours: Temp:  [97.8 F (36.6 C)-98.4 F (36.9 C)] 98.4 F (36.9 C) (08/27 0524) Pulse Rate:  [67-88] 82 (08/27 0524) Resp:  [16-18] 18 (08/27 0524) BP: (99-109)/(59-70) 101/70 (08/27 0524) SpO2:  [99 %] 99 % (08/27 0524)  Physical Exam:  General: alert, cooperative and appears stated age Lochia: appropriate Uterine Fundus: firm Incision: healing well DVT Evaluation: No evidence of DVT seen on physical exam.  Recent Labs    12/30/18 0654 12/31/18 0647  HGB 12.6 10.3*  HCT 37.7 31.2*    Assessment/Plan: Status post Cesarean section. Doing well postoperatively.  Continue current care. Anticipate discharge tomorrow unless peds releases baby today Vanessa Kick 01/01/2019, 10:52 AM

## 2019-01-01 NOTE — Lactation Note (Signed)
This note was copied from a baby's chart. Lactation Consultation Note Baby 81 hrs old. 8% wt. Loss. Baby jaundice in appearance. Jaundice TCB elevated. Serum will be drawn. Baby had gulping at breast. Discussed forceful let down. Mom states baby pops off and cries. Discussed could be gas, try to burp.  Discussed positioning, support, and props. Mom has everted nipples. Lt. Everted nipple has inverted center w/abrasion. Mom stated it bled yesterday. No bleeding noted today. Comfort gels given.  Discussed mom using DEBP to pump and supplement after BF. Mom in agreement. Gave mom supplement information sheet for BF. Discussed w/mom may give baby 15 ml. Discussed w/mom about discussing w/MD if jaundice serum elevated, how much or does MD want the baby to be supplemented. Mom shown how to use DEBP & how to disassemble, clean, & reassemble parts.  Mom knows to pump q3h for 15-20 min. Mom stated when she get home mom will be giving some bottles w/BM. Would like to supplement w/bottles and nipples. Slow flow nipples provided. Asked mom to call for assistance or questions. Praised mom for all her hard work w/BF. Encouraged mom to rest if possible while baby resting. Encouraged FOB to tend to baby while mom rest. Mom states she only had 5 hrs sleep in 2 days and is tired. Almost tearful. Reported to RN and on coming [place].  Patient Name: Girl Margurete Guaman [id number] Date: 01/01/2019 Reason for consult: Nipple pain/trauma;Term   Maternal Data Has patient been taught Hand Expression?: Yes Does the patient have breastfeeding experience prior to this delivery?: Yes  Feeding Feeding Type: Breast Fed  LATCH Score Latch: Grasps breast easily, tongue down, lips flanged, rhythmical sucking.  Audible Swallowing: Spontaneous and intermittent  Type of Nipple: Everted at rest and after stimulation  Comfort (Breast/Nipple): Filling, red/small blisters or bruises, mild/mod discomfort(abrasion)  Hold  (Positioning): No assistance needed to correctly position infant at breast.  LATCH Score: 9  Interventions Interventions: Shells;Comfort gels;DEBP;Position options;Hand express;Breast massage;Support pillows;Adjust position;Skin to skin;Assisted with latch;Breast compression;Breast feeding basics reviewed  Lactation Tools Discussed/Used Tools: Shells;Pump;Coconut oil;Comfort gels Shell Type: Inverted Breast pump type: Double-Electric Breast Pump WIC Program: No Pump Review: Setup, frequency, and cleaning;Milk Storage Initiated by:: Allayne Stack RN IBCLC Date initiated:: 01/01/19   Consult Status Consult Status: Follow-up Date: 01/01/19 Follow-up type: In-patient    Filicia Scogin, Elta Gu[place]oupe 01/01/2019, 7:23 AM

## 2019-01-01 NOTE — Discharge Instructions (Signed)
You can take Tylenol again at 11pm. You can follow the instruction on the over the counter bottle for 650mg .   You can take Ibuprofen again around 10:30pm tonight. You have been taking 800mg  every 8 hours for pain.

## 2019-02-15 NOTE — Discharge Summary (Signed)
Obstetric Discharge Summary Reason for Admission: onset of labor Prenatal Procedures: ultrasound Intrapartum Procedures: cesarean: low cervical, transverse Postpartum Procedures: none Complications-Operative and Postpartum: none Hemoglobin  Date Value Ref Range Status  12/31/2018 10.3 (L) 12.0 - 15.0 g/dL Final   HCT  Date Value Ref Range Status  12/31/2018 31.2 (L) 36.0 - 46.0 % Final    Physical Exam:  General: alert, cooperative and appears stated age 33: appropriate Uterine Fundus: firm Incision: healing well DVT Evaluation: No evidence of DVT seen on physical exam.  Discharge Diagnoses: Term Pregnancy-delivered  Discharge Information: Date: 02/15/2019 Activity: pelvic rest Diet: routine Medications: Ibuprofen, Colace and Percocet Condition: improved Instructions: refer to practice specific booklet Discharge to: home   Newborn Data: Live born female  Birth Weight: 9 lb 13.5 oz (4465 g) APGAR: 8, 9  Newborn Delivery   Birth date/time: 12/30/2018 19:05:00 Delivery type: C-Section, Low Transverse Trial of labor: Yes C-section categorization: Repeat      Home with mother.  Vanessa Kick 02/15/2019, 4:00 PM

## 2019-02-16 IMAGING — US ULTRASOUND RIGHT BREAST LIMITED
1 series · 7 of 7 positions shown · non-contrast
Comparison: None

CLINICAL DATA: 32-year-old patient in first trimester of pregnancy.
For approximately 1 year she has intermittently palpated a lump in
the 10 o'clock position of the right breast, but since becoming
pregnant, she believes this mass is larger than when she first
noticed it.

EXAM:
ULTRASOUND OF THE RIGHT BREAST

[Series 1: ultrasound right breast limited · 0.06mm/px · 7 of 7 slices shown]
[im 1/7]
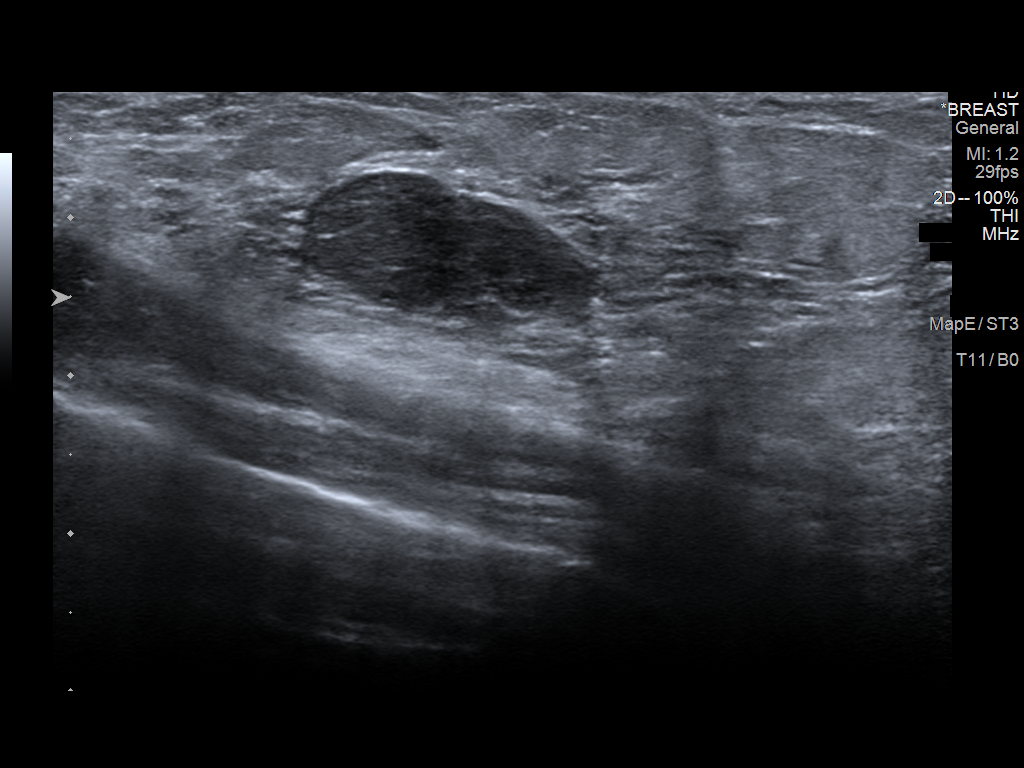
[im 2/7]
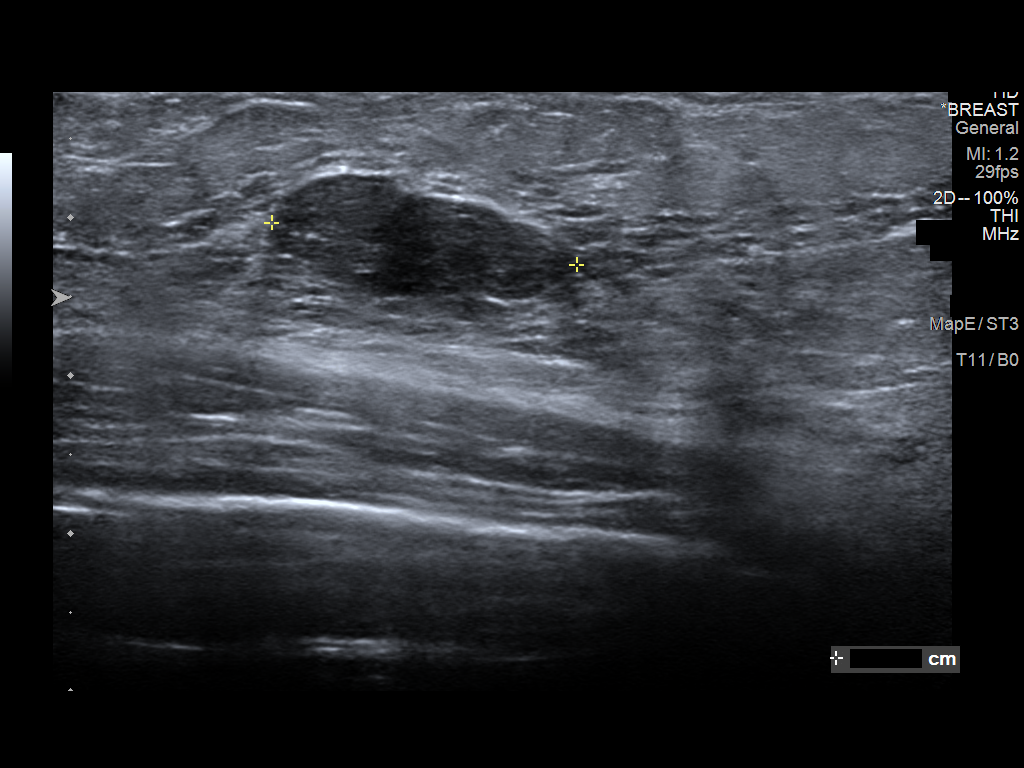
[im 3/7]
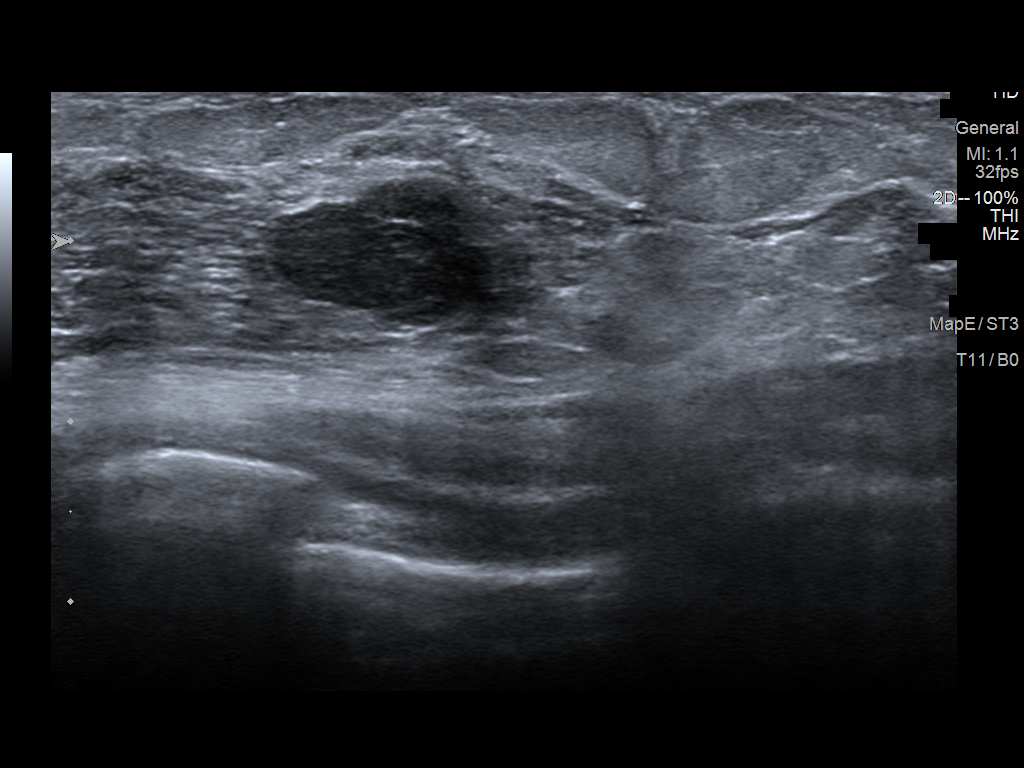
[im 4/7]
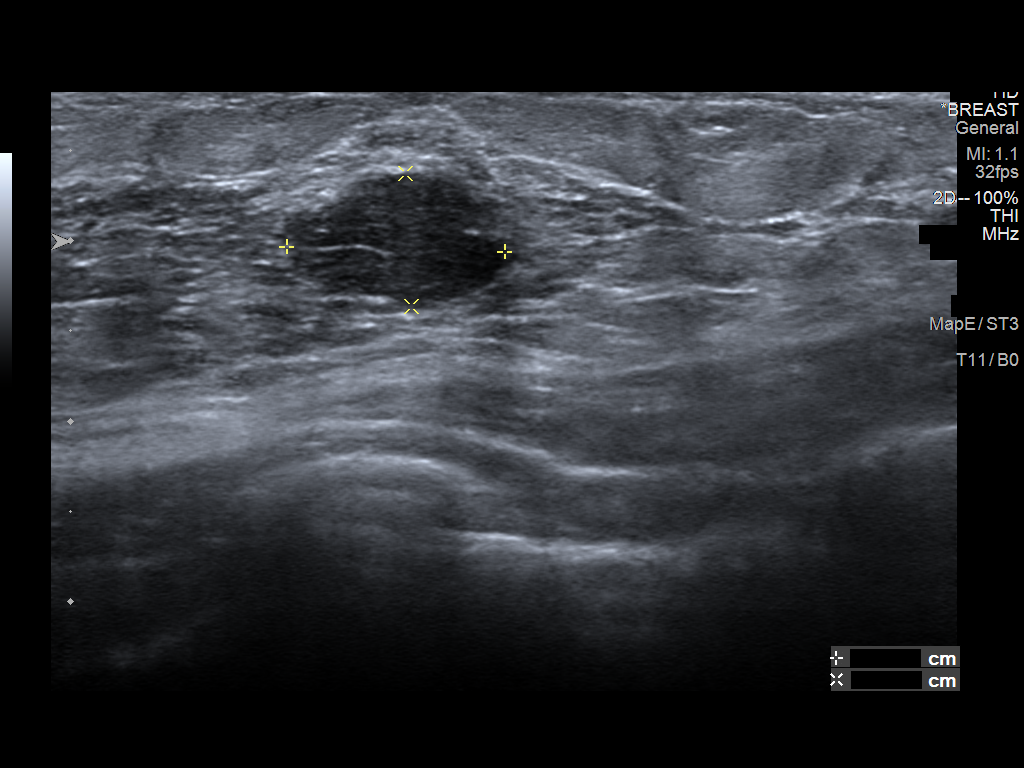
[im 5/7]
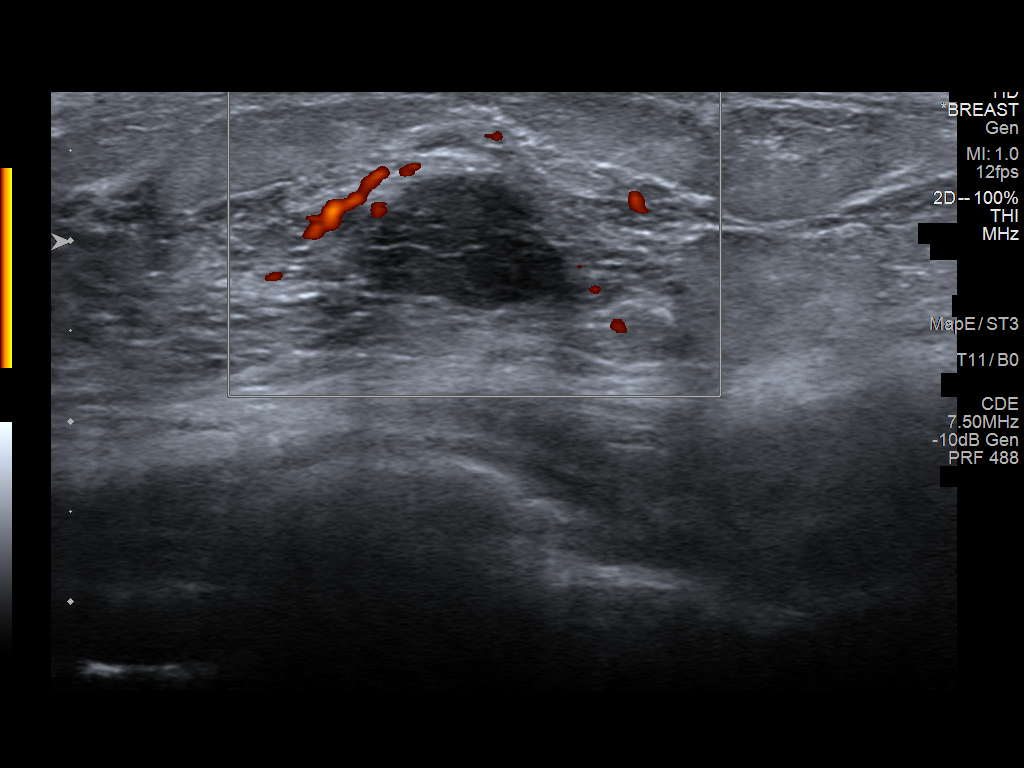
[im 6/7]
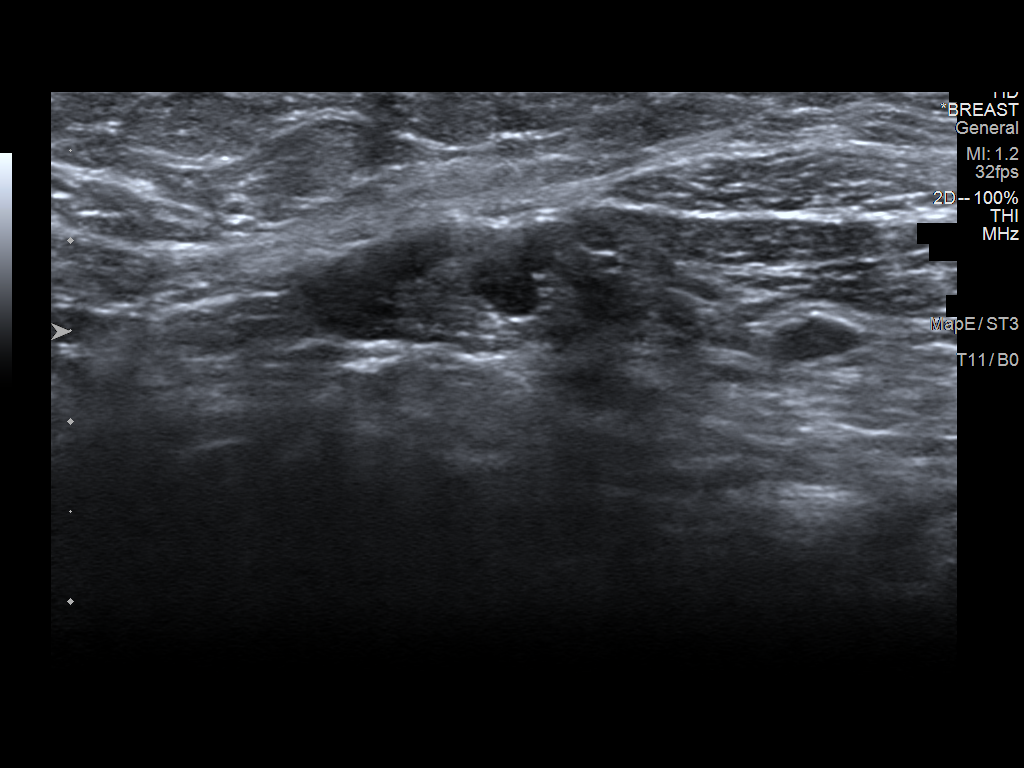
[im 7/7]
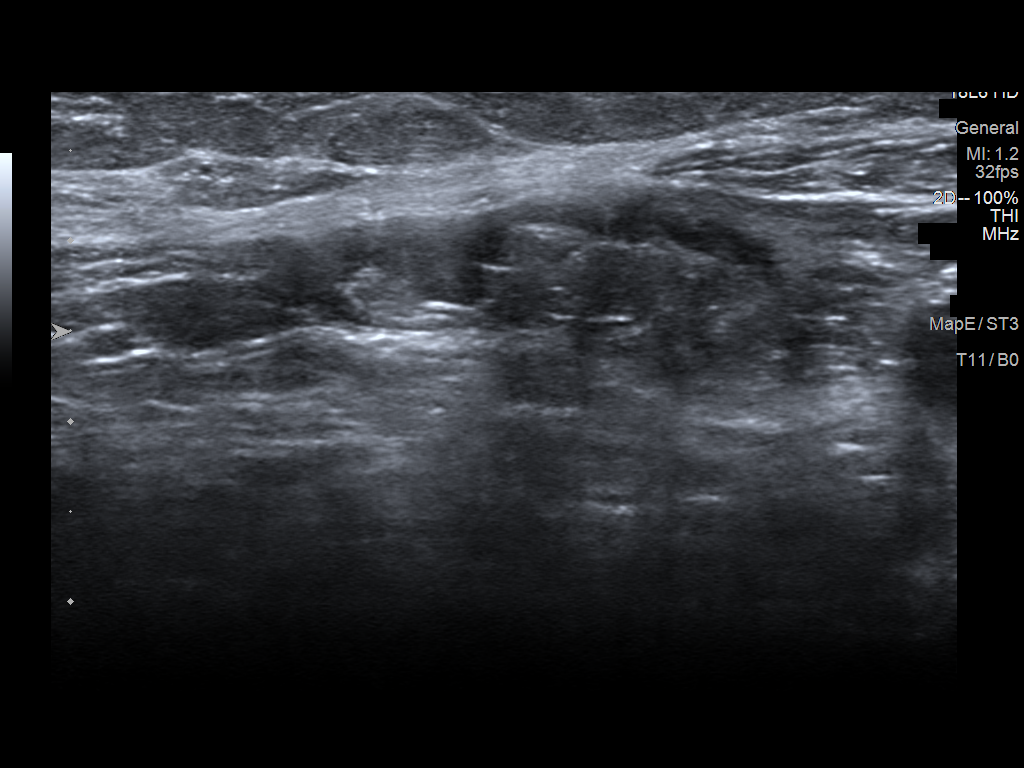

[7 of 7 positions shown; findings below may reference images not displayed]

FINDINGS: On physical exam, I do palpate smooth nodularity in the 10 o'clock
position of the right breast approximately 7 cm from the nipple.

Targeted ultrasound is performed, showing a homogeneously hypoechoic
oval parallel lobulated mass with internal echogenic bands at 10
o'clock position 7 cm from the nipple. The mass measures 2.0 x 1.2 x
0.7 cm. There is posterior acoustic enhancement.

Ultrasound of the right axilla shows 2 immediately adjacent lymph
nodes, within normal limits for size. No suspicious axillary lymph
nodes.
IMPRESSION: 2.0 cm palpable mass in the right breast at 10 o'clock position.
While this may be a fibroadenoma, the patient does think that it may
be larger than when she first noticed it. Malignancy cannot be
completely excluded.

RECOMMENDATION:
Ultrasound-guided core needle biopsy of the right breast is
recommended and is being scheduled for the patient.

I have discussed the findings and recommendations with the patient.
Results were also provided in writing at the conclusion of the
visit. If applicable, a reminder letter will be sent to the patient
regarding the next appointment.

BI-RADS CATEGORY  4: Suspicious.

## 2019-02-18 IMAGING — US US BREAST BX W LOC DEV 1ST LESION IMG BX SPEC US GUIDE*R*
1 series · 8 of 8 positions shown · non-contrast
Comparison: Previous exam(s).

Addendum:
CLINICAL DATA: 2.0 cm mass in the 10 o'clock position of the right
breast at recent ultrasound. The patient is in her 1st trimester of
pregnancy.

EXAM:
ULTRASOUND GUIDED RIGHT BREAST CORE NEEDLE BIOPSY

[Series 1: us breast bx w loc dev 1st lesion img bx spec us g · 0.07mm/px · 8 of 8 slices shown]
[im 1/8]
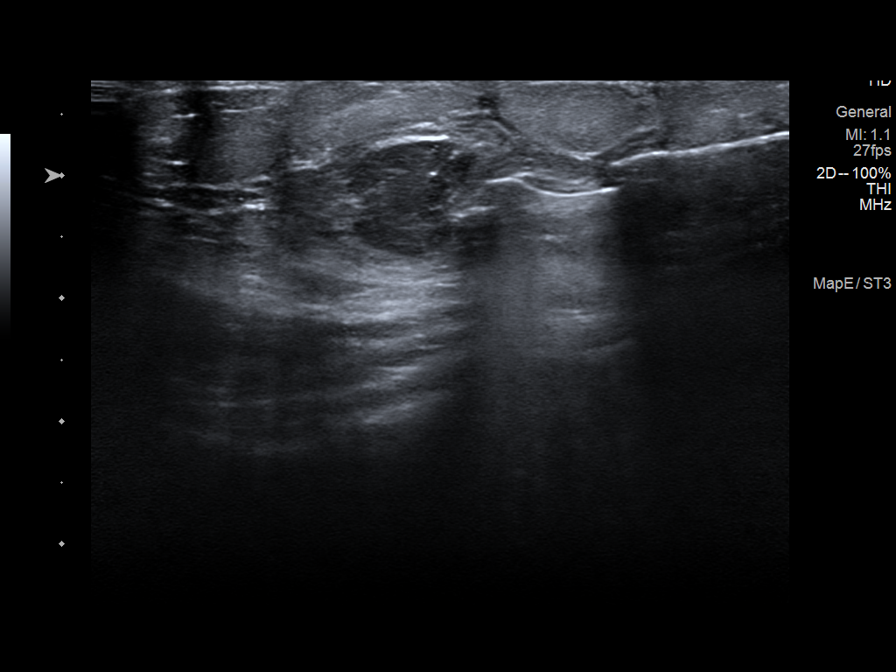
[im 2/8]
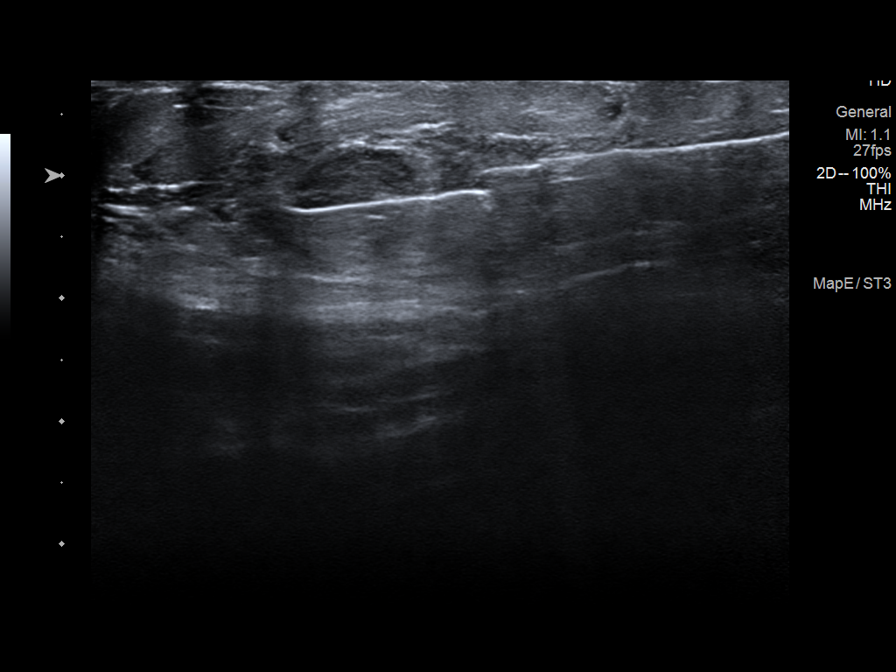
[im 3/8]
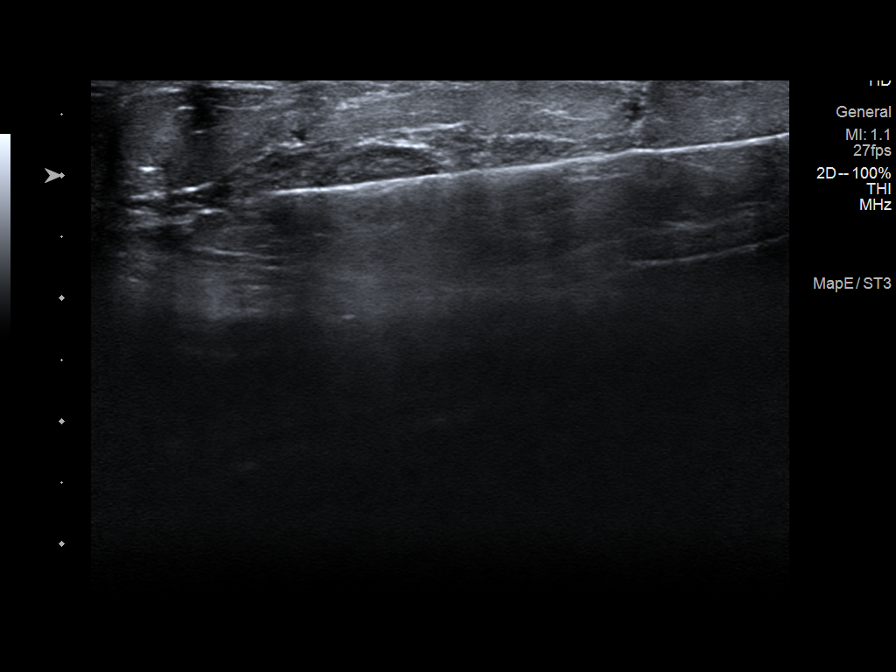
[im 4/8]
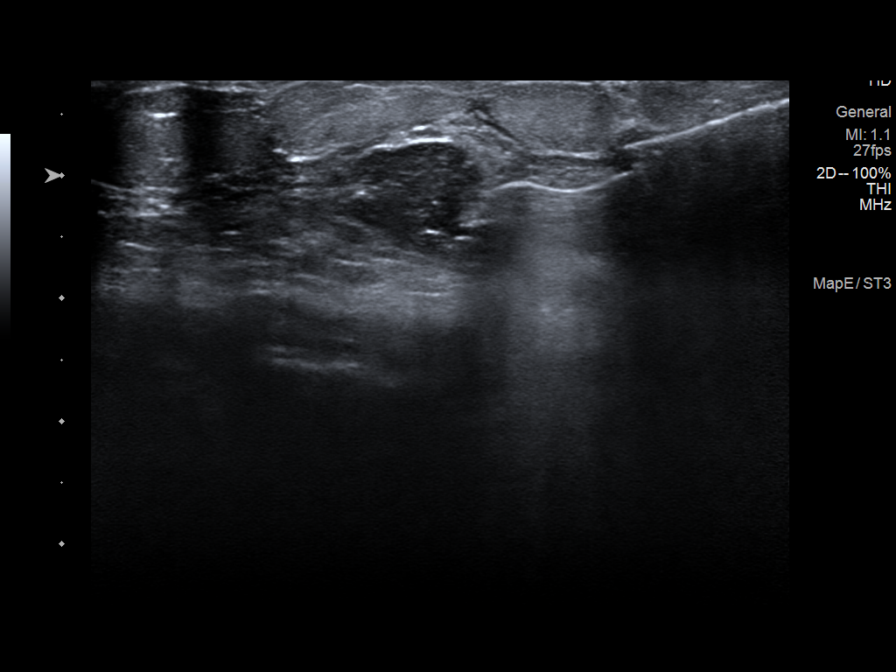
[im 5/8]
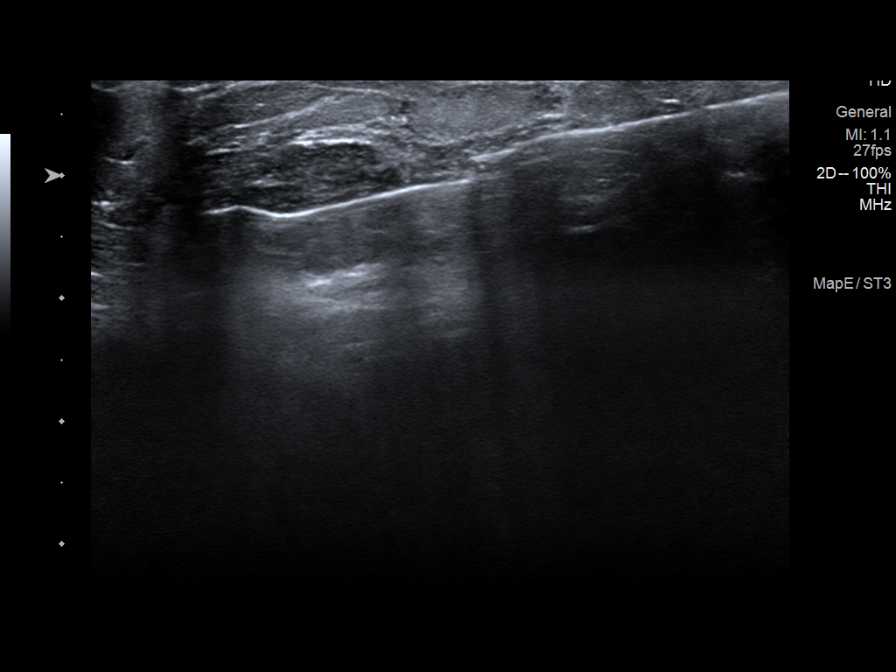
[im 6/8]
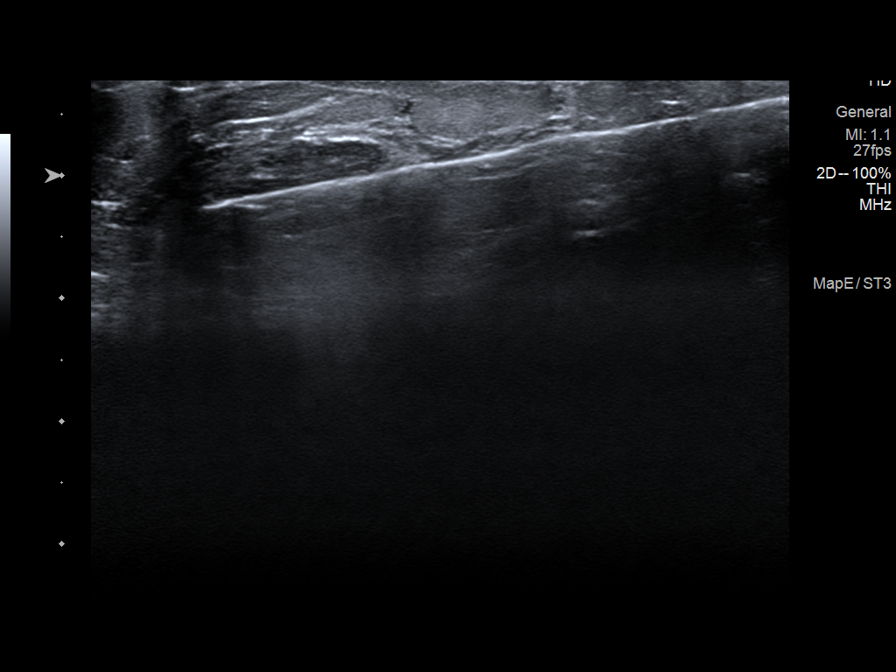
[im 7/8]
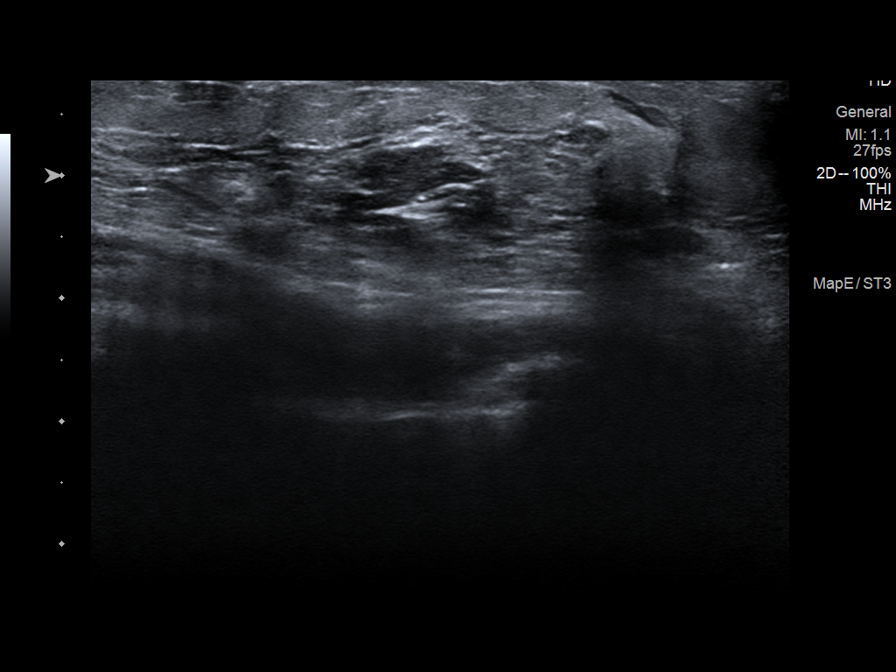
[im 8/8]
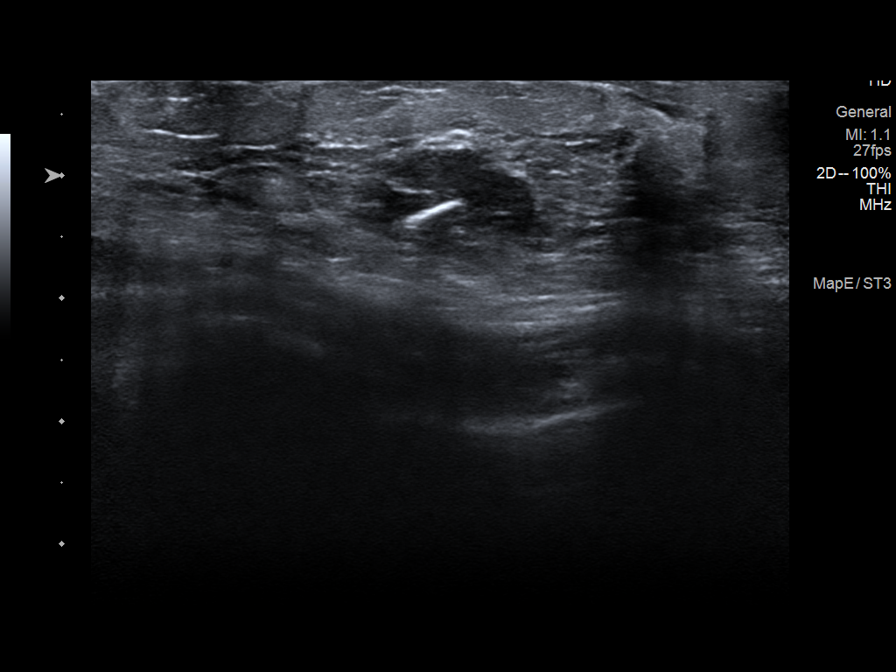

[8 of 8 positions shown; findings below may reference images not displayed]



Lesion quadrant: Upper outer quadrant

Using sterile technique and 1% Lidocaine as local anesthetic, under
direct ultrasound visualization, a 12 gauge Tarla device was
used to perform biopsy of the recently demonstrated 2.0 cm mass in
the 10 o'clock position of the right breast using a caudal approach.
At the conclusion of the procedure a spiral shaped HydroMARK tissue
marker clip was deployed into the biopsy cavity. Follow up 2 view
mammogram was performed and dictated separately.
IMPRESSION: Ultrasound guided biopsy of the recently demonstrated 2.0 cm mass in
the 10 o'clock position of the right breast. No apparent
complications.

ADDENDUM:
Pathology revealed FIBROADENOMA of the Right breast, 10 o'clock.
This was found to be concordant by Dr. Erielton Ochner.

Pathology results were discussed with the patient by telephone. The
patient reported doing well after the biopsy with tenderness at the
site. Post biopsy instructions and care were reviewed and questions
were answered. The patient was encouraged to call The [REDACTED]

The biopsy report indicated that a postprocedure mammogram was
obtained. That was an error. A postprocedure mammogram was not
obtained since a HydroMARK biopsy marker clip was used and the
patient is in her 1st trimester pregnancy.

The patient was instructed to begin annual screening mammography at
age 40.

Pathology results reported by Shimbode Doedens, RN on 06/05/2018.

*** End of Addendum ***

## 2019-03-30 ENCOUNTER — Encounter (HOSPITAL_COMMUNITY): Payer: Self-pay

## 2019-05-08 HISTORY — PX: BREAST LUMPECTOMY: SHX2

## 2020-03-16 ENCOUNTER — Ambulatory Visit: Payer: BC Managed Care – PPO | Admitting: Physical Therapy

## 2020-03-23 ENCOUNTER — Ambulatory Visit: Payer: BC Managed Care – PPO | Admitting: Physical Therapy

## 2020-06-20 ENCOUNTER — Other Ambulatory Visit: Payer: Self-pay

## 2020-06-20 ENCOUNTER — Encounter: Payer: Self-pay | Admitting: Physical Therapy

## 2020-06-20 ENCOUNTER — Ambulatory Visit: Payer: BC Managed Care – PPO | Attending: Obstetrics and Gynecology | Admitting: Physical Therapy

## 2020-06-20 DIAGNOSIS — M6281 Muscle weakness (generalized): Secondary | ICD-10-CM | POA: Insufficient documentation

## 2020-06-20 DIAGNOSIS — R279 Unspecified lack of coordination: Secondary | ICD-10-CM | POA: Diagnosis present

## 2020-06-20 DIAGNOSIS — R252 Cramp and spasm: Secondary | ICD-10-CM | POA: Insufficient documentation

## 2020-06-20 NOTE — Therapy (Signed)
[facility] [address], [address] [place], [place], [address] Phone: [phone]   Fax:  [phone]  Physical Therapy Evaluation  Patient Details  Name: Stephanie Hicks MRN: [id number] Date of Birth: 1985-06-02 Referring Provider (PT): Telford Nab, MD   Encounter Date: 06/20/2020   PT End of Session - 06/20/20 1716    Visit Number 1    Date for PT Re-Evaluation 09/12/20    Authorization Type BCBS    PT Start Time 1532    PT Stop Time 1615    PT Time Calculation (min) 43 min    Activity Tolerance Patient tolerated treatment well    Behavior During Therapy [facility] for tasks assessed/performed           Past Medical History:  Diagnosis Date  . Anxiety   . Depression   . Endometriosis   . Headache    migraines  . Pulmonary embolism (HCC) 2006   related to birth control pills    Past Surgical History:  Procedure Laterality Date  . CESAREAN SECTION N/A 12/01/2015   Procedure: CESAREAN SECTION;  Surgeon: Carrington Clamp, MD;  Location: [facility] BIRTHING SUITES;  Service: Obstetrics;  Laterality: N/A;  . CESAREAN SECTION WITH BILATERAL TUBAL LIGATION N/A 12/30/2018   Procedure: CESAREAN SECTION WITH BILATERAL TUBAL LIGATION;  Surgeon: Marlow Baars, MD;  Location: [facility] LD [facility]S;  Service: Obstetrics;  Laterality: N/A;  . COLPOSCOPY    . DRUG INDUCED ENDOSCOPY    . gyn laparoscopy    . KNEE ARTHROSCOPY  2003   right knee  . TONSILLECTOMY  2008    There were no vitals filed for this visit.    Subjective Assessment - 06/20/20 1534    Subjective Pt presents to skilled PT due to chronic pelvic pain.  Pt has had recent flare up starting last Thursday.  Pt states this flare up is the worst she has    Currently in Pain? Yes    Pain Score 6    8/10 worst   Pain Location Abdomen    Pain Orientation Right    Pain Descriptors / Indicators Radiating    Pain Radiating Towards Rt pelvis and Rt SI and radiates down front of Rt  thigh to lateral toes, it has been hard to lift my leg but that has improved with exercises    Pain Onset More than a month ago    Pain Frequency Intermittent    Aggravating Factors  intercourse irritates; positional like leaning on the Rt side, periods trigger the pain    Pain Relieving Factors baclofen, stretches, walking              [facility] PT Assessment - 06/21/20 0001      Assessment   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    Referring Provider (PT) Telford Nab, MD    Onset Date/Surgical Date --   chronic- flared last week   Prior Therapy No      Precautions   Precautions None      Restrictions   Weight Bearing Restrictions No      Balance Screen   Has the patient fallen in the past 6 months No      Home Environment   Living Environment Private residence    Living Arrangements Spouse/significant other;Children   2 children (1.5 y/o)     Prior Function   Level of Independence Independent    Vocation Full time employment  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; walking - work at [employer]   Overall Cognitive Status Within Functional Limits for tasks assessed      Posture/Postural Control   Posture/Postural Control Postural limitations    Postural Limitations Anterior pelvic tilt;Right pelvic obliquity    Posture Comments insupine rotation of pelvis to the Lt; long sitting no difference in leg length supine Lt LE is longer      ROM / Strength   AROM / PROM / Strength Strength;PROM      PROM   Overall PROM Comments hip flexion increased pain on Rt side      Strength   Overall Strength Comments Rt LE 4/5      Flexibility   Soft Tissue Assessment /Muscle Length yes    Hamstrings 70%      Palpation   Palpation comment palpation to inguinal ligament painful and ligament attachments; Rt side c-section incision TTP; tight lumbar erectors and gluteals      Special Tests    Special Tests Lumbar     Other special tests ASLR improved with compression to pelvis    Lumbar Tests Straight Leg Raise      Straight Leg Raise   Findings Positive    Side  Right    Comment dorsiflexion of foot causes symptoms      Ambulation/Gait   Gait Pattern Within Functional Limits                      Objective measurements completed on examination: See above findings.     Pelvic Floor Special Questions - 06/21/20 0001    Prior Pelvic/Prostate Exam Yes    Are you Pregnant or attempting pregnancy? No    Number of Pregnancies 2    Number of C-Sections 2    Currently Sexually Active Yes    Is this Painful Yes    Marinoff Scale pain prevents any attempts at intercourse    Urinary Leakage Yes    How often rarely    Pad use No    Activities that cause leaking Sneezing    Urinary frequency normal    Fluid intake a lot of water    Pelvic Floor Internal Exam pt identity confirmed and informed consent given to perform internal Soft tissue assessment    Exam Type Vaginal    Palpation TTP throughout more severe Rt side    Strength weak squeeze, no lift    Strength # of reps --   7 quick flicks in 10 sec   Strength # of seconds 5    Tone high                    PT Education - 06/21/20 1306    Education Details discussed and educated on diaphragmatic breathing    Person(s) Educated Patient    Methods Explanation;Demonstration;Tactile cues;Verbal cues    Comprehension Verbalized understanding;Returned demonstration            PT Short Term Goals - 06/21/20 1259      PT SHORT TERM GOAL #1   Title Pt will be able to sit with even weight distribution bilaterally and no increased pain for at least 45 minutes at a time    Time 4    Period Weeks    Status New    Target Date 07/18/20      PT SHORT TERM GOAL #2   Title Pt will  be ind with initial HEP    Time 4    Period Weeks    Status New    Target Date 07/18/20             PT Long Term Goals - 06/20/20 1540       PT LONG TERM GOAL #1   Title Pt will reduce pain by at least 75%    Time 12    Period Weeks    Status New    Target Date 09/12/20      PT LONG TERM GOAL #2   Title Pt will have 75% less leakage and be able to jump rope for at least 30 seconds    Time 12    Period Weeks    Status New    Target Date 09/12/20      PT LONG TERM GOAL #3   Title Pt will be ind with advanced HEP    Time 12    Period Weeks    Status New    Target Date 09/12/20      PT LONG TERM GOAL #4   Title Pt will be able to have intercourse with 1/3 marinoff scale at most    Time 12    Period Weeks    Status New    Target Date 09/12/20                  Plan - 06/20/20 1558    Clinical Impression Statement Pt presents to skilled PT due to pelvic and leg pain that she has had since last June after second c-section.  Pt has neural tension in hamstring based on positive PSLR on Rt side.  Pt has positvive transversus abdominus weakness based on ASL, Lt LE longer in supine and evens out in long sitting due muscle imbalance and pelvic obliquity Rt side.  Pt has pain with AROM of Rt hip and Rt LE weakness as noted above.  Overally high tone pelvic floor with more severe tenderness on the Rt side.  Pt will benefit from addressing these and all above mentioned impairments in order to participate fully in her personal relationships and job related functions    Personal Factors and Comorbidities Comorbidity 1;Time since onset of injury/illness/exacerbation    Comorbidities 2 c-secion    Examination-Activity Limitations Toileting;Continence;Sit    Examination-Participation Restrictions Community Activity;Interpersonal Relationship;Occupation    Stability/Clinical Decision Making Unstable/Unpredictable    Clinical Decision Making Moderate    Rehab Potential Excellent    PT Frequency 1x / week    PT Duration 12 weeks    PT Treatment/Interventions ADLs/Self Care Home Management;Biofeedback;Cryotherapy;Electrical  Stimulation;Moist Heat;Therapeutic activities;Traction;Therapeutic exercise;Neuromuscular re-education;Patient/family education;Manual techniques;Taping;Dry needling    PT Next Visit Plan abdominal fascial release and internal STM ; educate on doing the self massage and breathing with stretching lumbar    PT Home Exercise Plan diaphragmatic breathing - issue handout next    Consulted and Agree with Plan of Care Patient           Patient will benefit from skilled therapeutic intervention in order to improve the following deficits and impairments:  Pain,Postural dysfunction,Impaired flexibility,Increased fascial restricitons,Decreased strength,Decreased coordination,Decreased range of motion,Increased muscle spasms,Impaired tone  Visit Diagnosis: Muscle weakness (generalized)  Cramp and spasm  Unspecified lack of coordination     Problem List Patient Active Problem List   Diagnosis Date Noted  . History of pulmonary embolism 12/30/2018  . History of preterm delivery 12/30/2018  . History of venous thromboembolism 12/30/2018  Brayton CavesJakki L Abraham Entwistle, PT 06/21/2020, 1:07 PM  [facility] [address], [address] [place], [place], [address] Phone: [phone]   Fax:  [phone]  Name: Darrell Jewellecia O Halberg MRN: [id number] Date of Birth: 06/04/1985

## 2020-07-19 ENCOUNTER — Other Ambulatory Visit: Payer: Self-pay

## 2020-07-19 ENCOUNTER — Ambulatory Visit: Payer: BC Managed Care – PPO | Attending: Obstetrics and Gynecology | Admitting: Physical Therapy

## 2020-07-19 ENCOUNTER — Encounter: Payer: Self-pay | Admitting: Physical Therapy

## 2020-07-19 DIAGNOSIS — R252 Cramp and spasm: Secondary | ICD-10-CM | POA: Diagnosis present

## 2020-07-19 DIAGNOSIS — M6281 Muscle weakness (generalized): Secondary | ICD-10-CM | POA: Insufficient documentation

## 2020-07-19 DIAGNOSIS — R279 Unspecified lack of coordination: Secondary | ICD-10-CM | POA: Diagnosis present

## 2020-07-19 NOTE — Therapy (Signed)
[facility] [address] [place], [place], [address] Phone: [phone]   Fax:  [phone]  Physical Therapy Treatment  Patient Details  Name: Stephanie Hicks MRN: [id number] Date of Birth: 1986-03-23 Referring Provider (PT): Telford Nab, MD   Encounter Date: 07/19/2020   PT End of Session - 07/19/20 1450    Visit Number 2    Date for PT Re-Evaluation 09/12/20    Authorization Type BCBS    PT Start Time 1402    PT Stop Time 1443    PT Time Calculation (min) 41 min    Activity Tolerance Patient tolerated treatment well    Behavior During Therapy [facility] for tasks assessed/performed           Past Medical History:  Diagnosis Date  . Anxiety   . Depression   . Endometriosis   . Headache    migraines  . Pulmonary embolism (HCC) 2006   related to birth control pills    Past Surgical History:  Procedure Laterality Date  . CESAREAN SECTION N/A 12/01/2015   Procedure: CESAREAN SECTION;  Surgeon: Carrington Clamp, MD;  Location: [facility] BIRTHING SUIT[facility];  Service: Obstetrics;  Laterality: N/A;  . CESAREAN SECTION WITH BILATERAL TUBAL LIGATION N/A 12/30/2018   Procedure: CESAREAN SECTION WITH BILATERAL TUBAL LIGATION;  Surgeon: Marlow Baars, MD;  Location: [facility] LD ORS;  Service: Obstetrics;  Laterality: N/A;  . COLPOSCOPY    . DRUG INDUCED ENDOSCOPY    . gyn laparoscopy    . KNEE ARTHROSCOPY  2003   right knee  . TONSILLECTOMY  2008    There were no vitals filed for this visit.   Subjective Assessment - 07/19/20 1407    Subjective Pt states she is having a flare up but not as bad as the last time she was here since she has been doing the breathing and stretches    Currently in Pain? Yes    Pain Score 5     Pain Location Groin    Pain Orientation Right    Pain Descriptors / Indicators Sore    Pain Type Chronic pain    Pain Onset More than a month ago    Multiple Pain Sites No                              [facility] Adult PT Treatment/Exercise - 07/19/20 0001      Exercises   Exercises Lumbar      Lumbar Exercises: Quadruped   Single Arm Raise 5 reps    Other Quadruped Lumbar Exercises lean on table with toes in      Manual Therapy   Manual Therapy Myofascial release;Internal Pelvic Floor    Manual therapy comments pr identity confirmed    Myofascial Release abdominal lower Rt>Lt    Internal Pelvic Floor Lt levators and fascial release sweeping around the left side                  PT Education - 07/19/20 1448    Education Details URL: [email or web address]  Date: 07/19/2020  Prepared by: Dwana Curd    Exercises  Standing Low Back Flexion at Table - 3 x daily - 7 x weekly - 1 sets - 8 reps  Quadruped Exhale with Pelvic Floor Contraction and Arm Raise - 1 x daily - 7 x weekly - 3 sets - 10 reps  Quadruped Exhale with Pelvic Floor Contraction and  Leg Extension - 1 x daily - 7 x weekly - 3 sets - 10 reps    Person(s) Educated Patient    Methods Explanation;Demonstration;Tactile cues;Verbal cues;Handout    Comprehension Verbalized understanding;Returned demonstration            PT Short Term Goals - 06/21/20 1259      PT SHORT TERM GOAL #1   Title Pt will be able to sit with even weight distribution bilaterally and no increased pain for at least 45 minutes at a time    Time 4    Period Weeks    Status New    Target Date 07/18/20      PT SHORT TERM GOAL #2   Title Pt will be ind with initial HEP    Time 4    Period Weeks    Status New    Target Date 07/18/20             PT Long Term Goals - 06/20/20 1540      PT LONG TERM GOAL #1   Title Pt will reduce pain by at least 75%    Time 12    Period Weeks    Status New    Target Date 09/12/20      PT LONG TERM GOAL #2   Title Pt will have 75% less leakage and be able to jump rope for at least 30 seconds    Time 12    Period Weeks    Status New     Target Date 09/12/20      PT LONG TERM GOAL #3   Title Pt will be ind with advanced HEP    Time 12    Period Weeks    Status New    Target Date 09/12/20      PT LONG TERM GOAL #4   Title Pt will be able to have intercourse with 1/3 marinoff scale at most    Time 12    Period Weeks    Status New    Target Date 09/12/20                 Plan - 07/19/20 1445    Clinical Impression Statement Pt present to clinic much better than at eval.  Slight flare up from running.  Pt educated on basic core and pelvic floor ex's and was able to do correctly.  pt has fascial restriction in lower abdomen Rt>Lt and got good release from fascial release techniques. Pt did well withinternal soft tissue and was educated on doing this at home as well.    PT Treatment/Interventions ADLs/Self Care Home Management;Biofeedback;Cryotherapy;Electrical Stimulation;Moist Heat;Therapeutic activities;Traction;Therapeutic exercise;Neuromuscular re-education;Patient/family education;Manual techniques;Taping;Dry needling    PT Next Visit Plan progress core and pelvic floor, f/u on initial HEP    PT Home Exercise Plan Access Code: [email or web address]  URL: [email or web address]  Date: 07/19/2020  Prepared by: Dwana Curd    Exercises  Standing Low Back Flexion at Table - 3 x daily - 7 x weekly - 1 sets - 8 reps  Quadruped Exhale with Pelvic Floor Contraction and Arm Raise - 1 x daily - 7 x weekly - 3 sets - 10 reps  Quadruped Exhale with Pelvic Floor Contraction and Leg Extension - 1 x daily - 7 x weekly - 3 sets - 10 reps    Consulted and Agree with Plan of Care Patient           Patient will benefit from skilled therapeutic intervention in order  to improve the following deficits and impairments:  Pain,Postural dysfunction,Impaired flexibility,Increased fascial restricitons,Decreased strength,Decreased coordination,Decreased range of motion,Increased muscle spasms,Impaired tone  Visit Diagnosis: Muscle  weakness (generalized)  Cramp and spasm  Unspecified lack of coordination     Problem List Patient Active Problem List   Diagnosis Date Noted  . History of pulmonary embolism 12/30/2018  . History of preterm delivery 12/30/2018  . History of venous thromboembolism 12/30/2018    Junious Silk, PT 07/19/2020, 2:50 PM  [facility] [address], [address] [place], [place], [address] Phone: [phone]   Fax:  [phone]  Name: PERRIE RAGIN MRN: [id number] Date of Birth: August 06, 1985

## 2020-07-19 NOTE — Patient Instructions (Signed)
Access Code: [email or web address] URL: [email or web address] Date: 07/19/2020 Prepared by: Dwana Curd  Exercises Standing Low Back Flexion at Table - 3 x daily - 7 x weekly - 1 sets - 8 reps Quadruped Exhale with Pelvic Floor Contraction and Arm Raise - 1 x daily - 7 x weekly - 3 sets - 10 reps Quadruped Exhale with Pelvic Floor Contraction and Leg Extension - 1 x daily - 7 x weekly - 3 sets - 10 reps

## 2020-08-02 ENCOUNTER — Encounter: Payer: BC Managed Care – PPO | Admitting: Physical Therapy

## 2020-08-09 ENCOUNTER — Ambulatory Visit: Payer: BC Managed Care – PPO | Attending: Obstetrics and Gynecology | Admitting: Physical Therapy

## 2020-08-09 ENCOUNTER — Other Ambulatory Visit: Payer: Self-pay

## 2020-08-09 DIAGNOSIS — M6281 Muscle weakness (generalized): Secondary | ICD-10-CM | POA: Diagnosis not present

## 2020-08-09 DIAGNOSIS — R279 Unspecified lack of coordination: Secondary | ICD-10-CM | POA: Diagnosis present

## 2020-08-09 DIAGNOSIS — R252 Cramp and spasm: Secondary | ICD-10-CM | POA: Insufficient documentation

## 2020-08-09 NOTE — Therapy (Signed)
[facility] [address], [place], [place], [address] Phone: [phone]   Fax:  [phone]  Physical Therapy Treatment  Patient Details  Name: Stephanie Hicks MRN: [id number] Date of Birth: 1985/10/21 Referring Provider (PT): Lonia Skinner, MD   Encounter Date: 08/09/2020   PT End of Session - 08/09/20 1540    Visit Number 3    Date for PT Re-Evaluation 09/12/20    Authorization Type BCBS    PT Start Time 1530    PT Stop Time 1610    PT Time Calculation (min) 40 min    Activity Tolerance Patient tolerated treatment well    Behavior During Therapy [facility] for tasks assessed/performed           Past Medical History:  Diagnosis Date  . Anxiety   . Depression   . Endometriosis   . Headache    migraines  . Pulmonary embolism ([place]) 2006   related to birth control pills    Past Surgical History:  Procedure Laterality Date  . CESAREAN SECTION N/A 12/01/2015   Procedure: CESAREAN SECTION;  Surgeon: Bobbye [place], MD;  Location: [place];  Service: Obstetrics;  Laterality: N/A;  . CESAREAN SECTION WITH BILATERAL TUBAL LIGATION N/A 12/30/2018   Procedure: CESAREAN SECTION WITH BILATERAL TUBAL LIGATION;  Surgeon: Jerelyn Charles, MD;  Location: [place] LD ORS;  Service: Obstetrics;  Laterality: N/A;  . COLPOSCOPY    . DRUG INDUCED ENDOSCOPY    . gyn laparoscopy    . KNEE ARTHROSCOPY  2003   right knee  . TONSILLECTOMY  2008    There were no vitals filed for this visit.   Subjective Assessment - 08/09/20 1537    Subjective Pt states she was flared up significantly after the MFR.  I am about 50-75% better with pain management    Currently in Pain? Yes    Pain Score 2     Pain Location Hip    Pain Orientation Right    Pain Descriptors / Indicators Discomfort;Dull    Pain Type Chronic pain    Pain Radiating Towards proximal adductors    Pain Onset More than a month ago    Aggravating Factors  am  getting my period soon    Multiple Pain Sites No                             [facility] Adult PT Treatment/Exercise - 08/09/20 0001      Neuro Re-ed    Neuro Re-ed Details  gluteal and arch activation in single leg and double standing for improved fucntional movements      Lumbar Exercises: Stretches   Sports administrator Right;Left;1 rep;20 seconds      Lumbar Exercises: Standing   Other Standing Lumbar Exercises hip hinge, staggared stance hip hinge - 10x    Other Standing Lumbar Exercises squat with arches and external rotation      Lumbar Exercises: Supine   Bridge 10 reps    Straight Leg Raise 10 reps      Lumbar Exercises: Sidelying   Hip Abduction 10 reps      Lumbar Exercises: Prone   Straight Leg Raise 10 reps                  PT Education - 08/09/20 1616    Education Details Access Code: [email or web address]    Person(s) Educated Patient    Methods Explanation;Demonstration;Verbal cues;Handout;Tactile  cues    Comprehension Verbalized understanding;Returned demonstration            PT Short Term Goals - 08/09/20 1536      PT SHORT TERM GOAL #1   Title Pt will be able to sit with even weight distribution bilaterally and no increased pain for at least 45 minutes at a time    Baseline can alternate now with a standing desk    Status Achieved      PT SHORT TERM GOAL #2   Title Pt will be ind with initial HEP    Status Achieved             PT Long Term Goals - 08/09/20 1549      PT LONG TERM GOAL #1   Title Pt will reduce pain by at least 75%    Status Partially Met      PT LONG TERM GOAL #2   Title Pt will have 75% less leakage and be able to jump rope for at least 30 seconds    Baseline much reduced, able to cough without leakage    Status Partially Met      PT LONG TERM GOAL #3   Title Pt will be ind with advanced HEP    Status On-going                 Plan - 08/09/20 1613    Clinical Impression Statement Pt has made a lot of  progress since previous visit.  She did have a lot of pain for over a week in the RLQ where the fascial release was done but it has been hurting less since then and feeling stronger.  Pt needed TC and VC to keep pelvis neutral an dwas educated on muscle activation from arch to hip for improved lateral forces to prevent collapse into Rt pelvis.  Pt will benefit from skilled PT to address these impairments and improve function.    PT Treatment/Interventions ADLs/Self Care Home Management;Biofeedback;Cryotherapy;Electrical Stimulation;Moist Heat;Therapeutic activities;Traction;Therapeutic exercise;Neuromuscular re-education;Patient/family education;Manual techniques;Taping;Dry needling    PT Next Visit Plan progress core and pelvic floor, f/u on HEP - hip hinge, progress gluteal strength, jumping/hopping for pelvic floor endurance    PT Home Exercise Plan Access Code: [email or web address]    Consulted and Agree with Plan of Care Patient           Patient will benefit from skilled therapeutic intervention in order to improve the following deficits and impairments:  Pain,Postural dysfunction,Impaired flexibility,Increased fascial restricitons,Decreased strength,Decreased coordination,Decreased range of motion,Increased muscle spasms,Impaired tone  Visit Diagnosis: Muscle weakness (generalized)  Cramp and spasm  Unspecified lack of coordination     Problem List Patient Active Problem List   Diagnosis Date Noted  . History of pulmonary embolism 12/30/2018  . History of preterm delivery 12/30/2018  . History of venous thromboembolism 12/30/2018    Jule Ser, PT 08/09/2020, 5:08 PM  [facility] [address], [place], [place], [address] Phone: [phone]   Fax:  [phone]  Name: Stephanie Hicks MRN: [id number] Date of Birth: December 23, 1985

## 2020-08-09 NOTE — Patient Instructions (Signed)
Access Code: [email or web address] URL: [email or web address] Date: 08/09/2020 Prepared by: Dwana Curd  Exercises Standing Low Back Flexion at Table - 3 x daily - 7 x weekly - 1 sets - 8 reps Quadruped Exhale with Pelvic Floor Contraction and Arm Raise - 1 x daily - 7 x weekly - 3 sets - 10 reps Quadruped Exhale with Pelvic Floor Contraction and Leg Extension - 1 x daily - 7 x weekly - 3 sets - 10 reps Supine Bridge with Pelvic Floor Contraction - 3 x daily - 7 x weekly - 1 sets - 10 reps - 3 sec hold Supine Straight Leg Raise with Pelvic Floor Contraction - 2 x daily - 7 x weekly - 10 reps - 2 sets Sidelying Hip Abduction - 1 x daily - 7 x weekly - 3 sets - 10 reps Squat with Chair Touch - 1 x daily - 7 x weekly - 3 sets - 10 reps Standing Hip Hinge with [place] - 1 x daily - 7 x weekly - 3 sets - 10 reps

## 2020-08-16 ENCOUNTER — Other Ambulatory Visit: Payer: Self-pay

## 2020-08-16 ENCOUNTER — Encounter: Payer: Self-pay | Admitting: Physical Therapy

## 2020-08-16 ENCOUNTER — Ambulatory Visit: Payer: BC Managed Care – PPO | Admitting: Physical Therapy

## 2020-08-16 DIAGNOSIS — R279 Unspecified lack of coordination: Secondary | ICD-10-CM

## 2020-08-16 DIAGNOSIS — M6281 Muscle weakness (generalized): Secondary | ICD-10-CM | POA: Diagnosis not present

## 2020-08-16 DIAGNOSIS — R252 Cramp and spasm: Secondary | ICD-10-CM

## 2020-08-16 NOTE — Therapy (Addendum)
[facility] [address] [place], [place], [address] Phone: [phone]   Fax:  [phone]  Physical Therapy Treatment  Patient Details  Name: Stephanie Hicks MRN: [id number] Date of Birth: 02-06-1986 Referring Provider (PT): Lonia Skinner, MD   Encounter Date: 08/16/2020   PT End of Session - 08/16/20 1519     Visit Number 4    Date for PT Re-Evaluation 09/12/20    Authorization Type BCBS    PT Start Time 6962    PT Stop Time 1603    PT Time Calculation (min) 40 min    Activity Tolerance Patient tolerated treatment well    Behavior During Therapy [facility] for tasks assessed/performed             Past Medical History:  Diagnosis Date   Anxiety    Depression    Endometriosis    Headache    migraines   Pulmonary embolism ([place]) 2006   related to birth control pills    Past Surgical History:  Procedure Laterality Date   CESAREAN SECTION N/A 12/01/2015   Procedure: CESAREAN SECTION;  Surgeon: Bobbye Charleston, MD;  Location: [place];  Service: Obstetrics;  Laterality: N/A;   CESAREAN SECTION WITH BILATERAL TUBAL LIGATION N/A 12/30/2018   Procedure: CESAREAN SECTION WITH BILATERAL TUBAL LIGATION;  Surgeon: Jerelyn Charles, MD;  Location: [place] [facility];  Service: Obstetrics;  Laterality: N/A;   COLPOSCOPY     DRUG INDUCED ENDOSCOPY     gyn laparoscopy     KNEE ARTHROSCOPY  2003   right knee   TONSILLECTOMY  2008    There were no vitals filed for this visit.   Subjective Assessment - 08/16/20 1715     Subjective Pt reports still having pain but better massaging adductors    Currently in Pain? Yes    Pain Score 1     Pain Location Hip    Pain Orientation Right    Pain Descriptors / Indicators Discomfort    Pain Type Chronic pain    Pain Onset More than a month ago    Pain Frequency Intermittent    Multiple Pain Sites No                               [facility] Adult PT  Treatment/Exercise - 08/16/20 0001       Lumbar Exercises: Standing   Functional Squats 10 reps    Functional Squats Limitations with flat back    Lifting Limitations red band - hip hinge    Forward Lunge 10 reps    Side Lunge 10 reps    Other Standing Lumbar Exercises bouncing on rebouder  3ways -    Other Standing Lumbar Exercises side steps red band      Manual Therapy   Myofascial Release obdurator internus from external ischial tuberosity                      PT Short Term Goals - 08/09/20 1536       PT SHORT TERM GOAL #1   Title Pt will be able to sit with even weight distribution bilaterally and no increased pain for at least 45 minutes at a time    Baseline can alternate now with a standing desk    Status Achieved      PT SHORT TERM GOAL #2   Title Pt will be ind  with initial HEP    Status Achieved               PT Long Term Goals - 08/16/20 1715       PT LONG TERM GOAL #1   Title Pt will reduce pain by at least 75%    Baseline still working on mild pain    Status Partially Met                   Plan - 08/16/20 1555     Clinical Impression Statement Pt has some tension in obdurator internus and released with STM done externally  Pt was educated in how to do this on herself as well as to get the pelvic wand.  Pt was able to progress strength for gluteals and core. She tolerated eliptical and bouncing. Pt does have some knee pain in Rt side that flares up with single leg squat/lunge ex's.  pt was given exercise to activate gluteals on that side without increased pain and did well with side step with band, needing cues to keep feet straight.    PT Treatment/Interventions ADLs/Self Care Home Management;Biofeedback;Cryotherapy;Electrical Stimulation;Moist Heat;Therapeutic activities;Traction;Therapeutic exercise;Neuromuscular re-education;Patient/family education;Manual techniques;Taping;Dry needling    PT Next Visit Plan progress core and  pelvic floor, f/u on HEP - hip hinge with resistance, side stepping, progress gluteal strength, jumping/hopping for pelvic floor endurance    PT Home Exercise Plan Access Code: [email or web address]    Consulted and Agree with Plan of Care Patient             Patient will benefit from skilled therapeutic intervention in order to improve the following deficits and impairments:  Pain,Postural dysfunction,Impaired flexibility,Increased fascial restricitons,Decreased strength,Decreased coordination,Decreased range of motion,Increased muscle spasms,Impaired tone  Visit Diagnosis: Muscle weakness (generalized)  Cramp and spasm  Unspecified lack of coordination     Problem List Patient Active Problem List   Diagnosis Date Noted   History of pulmonary embolism 12/30/2018   History of preterm delivery 12/30/2018   History of venous thromboembolism 12/30/2018    Jule Ser, PT 08/16/2020, 5:23 PM  [place] [address], [place], [place], [address] Phone: [phone]   Fax:  [phone]  Name: Stephanie Hicks MRN: [id number] Date of Birth: 1986/02/23  PHYSICAL THERAPY DISCHARGE SUMMARY  Visits from Start of Care: 4  Current functional level related to goals / functional outcomes: See above   Remaining deficits: See above details   Education / Equipment: HEP   Patient agrees to discharge. Patient goals were partially met. Patient is being discharged due to not returning since the last visit.  Gustavus Bryant, PT 11/28/20 2:04 PM

## 2020-08-23 ENCOUNTER — Encounter: Payer: BC Managed Care – PPO | Admitting: Physical Therapy

## 2020-08-30 ENCOUNTER — Encounter: Payer: BC Managed Care – PPO | Admitting: Physical Therapy

## 2020-09-06 ENCOUNTER — Ambulatory Visit: Payer: BC Managed Care – PPO | Admitting: Physical Therapy

## 2020-09-13 ENCOUNTER — Encounter: Payer: BC Managed Care – PPO | Admitting: Physical Therapy

## 2023-04-03 ENCOUNTER — Encounter (HOSPITAL_COMMUNITY): Payer: Self-pay | Admitting: Family Medicine

## 2023-04-11 ENCOUNTER — Other Ambulatory Visit (HOSPITAL_COMMUNITY): Payer: Self-pay | Admitting: Family Medicine

## 2023-04-11 DIAGNOSIS — M25552 Pain in left hip: Secondary | ICD-10-CM

## 2023-04-22 ENCOUNTER — Ambulatory Visit (HOSPITAL_COMMUNITY): Payer: BC Managed Care – PPO

## 2023-04-22 ENCOUNTER — Encounter (HOSPITAL_COMMUNITY): Payer: Self-pay

## 2023-04-22 ENCOUNTER — Inpatient Hospital Stay (HOSPITAL_COMMUNITY): Admission: RE | Admit: 2023-04-22 | Payer: BC Managed Care – PPO | Source: Ambulatory Visit

## 2023-05-31 ENCOUNTER — Encounter (HOSPITAL_COMMUNITY): Payer: Self-pay | Admitting: Radiology

## 2023-05-31 ENCOUNTER — Ambulatory Visit (HOSPITAL_COMMUNITY)
Admission: RE | Admit: 2023-05-31 | Discharge: 2023-05-31 | Disposition: A | Discharge status: Home / Self Care | Payer: 59 | Source: Ambulatory Visit | Attending: Family Medicine | Admitting: Family Medicine

## 2023-05-31 DIAGNOSIS — M25552 Pain in left hip: Secondary | ICD-10-CM | POA: Diagnosis present

## 2023-05-31 MED ORDER — IOHEXOL 180 MG/ML  SOLN
10.0000 mL | Freq: Once | INTRAMUSCULAR | Status: AC | PRN
  Administered 2023-05-31: 10 mL via INTRA_ARTICULAR

## 2023-05-31 MED ORDER — SODIUM CHLORIDE (PF) 0.9% IJ SOLUTION - NO CHARGE
10.0000 mL | Freq: Once | INTRAMUSCULAR | Status: AC
  Administered 2023-05-31: 10 mL
  Filled 2023-05-31: qty 10

## 2023-05-31 MED ORDER — GADOBUTROL 1 MMOL/ML IV SOLN
0.0500 mL | Freq: Once | INTRAVENOUS | Status: AC | PRN
  Administered 2023-05-31: 0.05 mL

## 2023-05-31 MED ORDER — LIDOCAINE HCL (PF) 1 % IJ SOLN
5.0000 mL | Freq: Once | INTRAMUSCULAR | Status: AC
  Administered 2023-05-31: 5 mL

## 2023-05-31 NOTE — Procedures (Signed)
Interventional Radiology Procedure Note  Risks and benefits of joint injection for arthrogram were discussed with the patient including, but not limited to bleeding, infection, injection of contrast outside the joint, and damage to adjacent structures.  All of the patient's questions were answered, patient is agreeable to proceed. Consent signed and in chart.  A timeout was performed with all members of the team prior to start of the procedure. Correct patient and correct procedure was confirmed. Allergies were reviewed.   PROCEDURE SUMMARY:  Successful fluoro guided left hip arthrogram. No immediate complications.  Pt tolerated well.   EBL = none  Please see full dictation in imaging section of Epic for procedure details.

## 2023-08-02 ENCOUNTER — Ambulatory Visit (HOSPITAL_COMMUNITY): Admit: 2023-08-02 | Payer: 59 | Admitting: Orthopedic Surgery

## 2023-08-02 SURGERY — ARTHROSCOPY HIP
Anesthesia: Choice | Site: Hip | Laterality: Left

## 2023-08-12 NOTE — Progress Notes (Signed)
 Surgical Instructions   Your procedure is scheduled on Friday, April 18th, 2025. Report to [facility] Main Entrance "A" at 11:30 A.M., then check in with the Admitting office. Any questions or running late day of surgery: call [phone]  Questions prior to your surgery date: call [phone], Monday-Friday, 8am-4pm. If you experience any cold or flu symptoms such as cough, fever, chills, shortness of breath, etc. between now and your scheduled surgery, please notify us at the above number.     Remember:  Do not eat after midnight the night before your surgery   You may drink clear liquids until 10:30 the morning of your surgery.   Clear liquids allowed are: Water, Non-Citrus Juices (without pulp), Carbonated Beverages, Clear Tea (no milk, honey, etc.), Black Coffee Only (NO MILK, CREAM OR POWDERED CREAMER of any kind), and Gatorade.  Patient Instructions  The night before surgery:  No food after midnight. ONLY clear liquids after midnight  The day of surgery (if you do NOT have diabetes):  Drink ONE (1) Pre-Surgery Clear Ensure by 10:30 the morning of surgery. Drink in one sitting. Do not sip.  This drink was given to you during your hospital  pre-op appointment visit.  Nothing else to drink after completing the  Pre-Surgery Clear Ensure.         If you have questions, please contact your surgeon's office.     Take these medicines the morning of surgery with A SIP OF WATER: None   May take these medicines IF NEEDED:    One week prior to surgery, STOP taking any Aspirin (unless otherwise instructed by your surgeon) Aleve, Naproxen, Ibuprofen, Motrin, Advil, Goody's, BC's, all herbal medications, fish oil, and non-prescription vitamins.  This includes your Meloxicam (Mobic)                     Do NOT Smoke (Tobacco/Vaping) for 24 hours prior to your procedure.  If you use a CPAP at night, you may bring your mask/headgear for your overnight stay.   You will be asked  to remove any contacts, glasses, piercing's, hearing aid's, dentures/partials prior to surgery. Please bring cases for these items if needed.    Patients discharged the day of surgery will not be allowed to drive home, and someone needs to stay with them for 24 hours.  SURGICAL WAITING ROOM VISITATION Patients may have no more than 2 support people in the waiting area - these visitors may rotate.   Pre-op nurse will coordinate an appropriate time for 1 ADULT support person, who may not rotate, to accompany patient in pre-op.  Children under the age of 94 must have an adult with them who is not the patient and must remain in the main waiting area with an adult.  If the patient needs to stay at the hospital during part of their recovery, the visitor guidelines for inpatient rooms apply.  Please refer to the [facility] website for the visitor guidelines for any additional information.   If you received a COVID test during your pre-op visit  it is requested that you wear a mask when out in public, stay away from anyone that may not be feeling well and notify your surgeon if you develop symptoms. If you have been in contact with anyone that has tested positive in the last 10 days please notify you surgeon.      Pre-operative CHG Bathing Instructions   You can play a key role in reducing the risk of  infection after surgery. Your skin needs to be as free of germs as possible. You can reduce the number of germs on your skin by washing with CHG (chlorhexidine gluconate) soap before surgery. CHG is an antiseptic soap that kills germs and continues to kill germs even after washing.   DO NOT use if you have an allergy to chlorhexidine/CHG or antibacterial soaps. If your skin becomes reddened or irritated, stop using the CHG and notify one of our RNs at [phone].              TAKE A SHOWER THE NIGHT BEFORE SURGERY AND THE DAY OF SURGERY    Please keep in mind the following:  DO NOT shave,  including legs and underarms, 48 hours prior to surgery.   You may shave your face before/day of surgery.  Place clean sheets on your bed the night before surgery Use a clean washcloth (not used since being washed) for each shower. DO NOT sleep with pet's night before surgery.  CHG Shower Instructions:  Wash your face and private area with normal soap. If you choose to wash your hair, wash first with your normal shampoo.  After you use shampoo/soap, rinse your hair and body thoroughly to remove shampoo/soap residue.  Turn the water OFF and apply half the bottle of CHG soap to a CLEAN washcloth.  Apply CHG soap ONLY FROM YOUR NECK DOWN TO YOUR TOES (washing for 3-5 minutes)  DO NOT use CHG soap on face, private areas, open wounds, or sores.  Pay special attention to the area where your surgery is being performed.  If you are having back surgery, having someone wash your back for you may be helpful. Wait 2 minutes after CHG soap is applied, then you may rinse off the CHG soap.  Pat dry with a clean towel  Put on clean pajamas    Additional instructions for the day of surgery: DO NOT APPLY any lotions, deodorants, cologne, or perfumes.   Do not wear jewelry or makeup Do not wear nail polish, gel polish, artificial nails, or any other type of covering on natural nails (fingers and toes) Do not bring valuables to the hospital. [facility] is not responsible for valuables/personal belongings. Put on clean/comfortable clothes.  Please brush your teeth.  Ask your nurse before applying any prescription medications to the skin.

## 2023-08-13 ENCOUNTER — Encounter (HOSPITAL_COMMUNITY)
Admission: RE | Admit: 2023-08-13 | Discharge: 2023-08-13 | Disposition: A | Discharge status: Home / Self Care | Source: Ambulatory Visit | Attending: Orthopedic Surgery | Admitting: Orthopedic Surgery

## 2023-08-13 ENCOUNTER — Encounter (HOSPITAL_COMMUNITY): Payer: Self-pay

## 2023-08-13 ENCOUNTER — Other Ambulatory Visit: Payer: Self-pay

## 2023-08-13 VITALS — BP 129/78 | HR 93 | Temp 97.8°F | Resp 16 | Ht 66.0 in | Wt 188.2 lb

## 2023-08-13 DIAGNOSIS — Z01812 Encounter for preprocedural laboratory examination: Secondary | ICD-10-CM | POA: Insufficient documentation

## 2023-08-13 DIAGNOSIS — S73192A Other sprain of left hip, initial encounter: Secondary | ICD-10-CM | POA: Insufficient documentation

## 2023-08-13 DIAGNOSIS — Z01818 Encounter for other preprocedural examination: Secondary | ICD-10-CM

## 2023-08-13 DIAGNOSIS — X58XXXA Exposure to other specified factors, initial encounter: Secondary | ICD-10-CM | POA: Diagnosis not present

## 2023-08-13 LAB — CBC
HCT: 38.2 % (ref 36.0–46.0)
Hemoglobin: 12.7 g/dL (ref 12.0–15.0)
MCH: 30.8 pg (ref 26.0–34.0)
MCHC: 33.2 g/dL (ref 30.0–36.0)
MCV: 92.5 fL (ref 80.0–100.0)
Platelets: 276 10*3/uL (ref 150–400)
RBC: 4.13 MIL/uL (ref 3.87–5.11)
RDW: 12.9 % (ref 11.5–15.5)
WBC: 8.3 10*3/uL (ref 4.0–10.5)
nRBC: 0 % (ref 0.0–0.2)

## 2023-08-13 NOTE — Progress Notes (Signed)
 PCP - Luna Kitchens, MD Cardiologist - denies  PPM/ICD - denies Device Orders - n/a Rep Notified - n/a  Chest x-ray - denies EKG - denies; n/a Stress Test - denies ECHO - denies  Cardiac Cath - denies  Sleep Study - denies CPAP - n/a  Fasting Blood Sugar - no DM Checks Blood Sugar _____ times a day  Last dose of GLP1 agonist-  n/a GLP1 instructions: n/a  Blood Thinner Instructions: n/a Aspirin Instructions: n/a  ERAS Protcol - yes; til  1030 AM PRE-SURGERY Ensure or G2- Ensure  COVID TEST- n/a   Anesthesia review: yes; pulmonary embolism in 2006 due to birth control pills.   Patient denies shortness of breath, fever, cough and chest pain at PAT appointment   All instructions explained to the patient, with a verbal understanding of the material. Patient agrees to go over the instructions while at home for a better understanding. Patient also instructed to self quarantine after being tested for COVID-19. The opportunity to ask questions was provided.

## 2023-08-14 NOTE — Progress Notes (Signed)
 Anesthesia Chart Review:  Case: [id number] Date/Time: 08/23/23 1315   Procedure: ARTHROSCOPY HIP WITH LABRUM REPAIR (Left: Hip)   Anesthesia type: Choice   Pre-op diagnosis: left hip labrum tear   [facility] / [facility]   Surgeons: Yolonda Kida, MD       DISCUSSION: Patient is a 38 year old female scheduled for the above procedure.  History includes never smoker, migraines, endometriosis (s/p hysterectomy 10/16/21), PE (2006 in setting of BCP).  Anesthesia team to evaluate on the day of surgery.   VS: BP 129/78   Pulse 93   Temp 36.6 C   Resp 16   Ht 5\' 6"  (1.676 m)   Wt 85.4 kg   LMP 07/16/2017   SpO2 100%   BMI 30.38 kg/m   PROVIDERS: Lise Auer, MD is PCP   LABS: Labs reviewed: Acceptable for surgery. (all labs ordered are listed, but only abnormal results are displayed)  Labs Reviewed  CBC    IMAGES: MRI left hip 05/31/2023: IMPRESSION: 1. 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 No focal cartilage abnormality. 2. No hip fracture, dislocation or avascular necrosis.   EKG: EKG 12/03/2015: Normal sinus rhythm with sinus arrhythmia   CV: N/A  Past Medical History:  Diagnosis Date   Anxiety    Depression    Endometriosis    Headache    migraines   Pulmonary embolism ([facility]) 2006   related to birth control pills    Past Surgical History:  Procedure Laterality Date   BREAST LUMPECTOMY Right 2021   CESAREAN SECTION N/A 12/01/2015   Procedure: CESAREAN SECTION;  Surgeon: Carrington Clamp, MD;  Location: [facility];  Service: Obstetrics;  Laterality: N/A;   CESAREAN SECTION WITH BILATERAL TUBAL LIGATION N/A 12/30/2018   Procedure: CESAREAN SECTION WITH BILATERAL TUBAL LIGATION;  Surgeon: Marlow Baars, MD;  [facility];  Service: Obstetrics;  Laterality: N/A;   COLPOSCOPY     DRUG INDUCED ENDOSCOPY     gyn laparoscopy     KNEE ARTHROSCOPY  2003   right knee   TONSILLECTOMY  2008     MEDICATIONS:  aspirin-acetaminophen-caffeine (EXCEDRIN MIGRAINE) 250-250-65 MG tablet   Cholecalciferol (VITAMIN D3 PO)   MAGNESIUM PO   meloxicam (MOBIC) 15 MG tablet   Multiple Vitamin (MULTIVITAMIN WITH MINERALS) TABS tablet   Polyethyl Glycol-Propyl Glycol (SYSTANE) 0.4-0.3 % SOLN   tiZANidine (ZANAFLEX) 2 MG tablet   No current facility-administered medications for this encounter.    Shonna Chock, PA-C Surgical Short Stay/Anesthesiology [facility] Phone [phone] [facility] Phone [phone] 08/14/2023 4:43 PM

## 2023-08-14 NOTE — Anesthesia Preprocedure Evaluation (Addendum)
 Anesthesia Evaluation  Patient identified by MRN, date of birth, ID band Patient awake    Reviewed: Allergy & Precautions, NPO status , Patient's Chart, lab work & pertinent test results  History of Anesthesia Complications Negative for: history of anesthetic complications  Airway Mallampati: I  TM Distance: >3 FB Neck ROM: Full   Comment: Previous grade I view with MAC 3, easy mask Dental  (+) Dental Advisory Given, Teeth Intact   Pulmonary neg shortness of breath, neg sleep apnea, neg COPD, neg recent URI, PE (2006, related to birth control pills)   Pulmonary exam normal breath sounds clear to auscultation       Cardiovascular negative cardio ROS  Rhythm:Regular Rate:Normal     Neuro/Psych  Headaches, neg Seizures PSYCHIATRIC DISORDERS Anxiety Depression       GI/Hepatic negative GI ROS, Neg liver ROS,,,  Endo/Other  negative endocrine ROS    Renal/GU negative Renal ROS     Musculoskeletal   Abdominal   Peds  Hematology negative hematology ROS (+) Lab Results      Component                Value               Date                      WBC                      8.3                 08/13/2023                HGB                      12.7                08/13/2023                HCT                      38.2                08/13/2023                MCV                      92.5                08/13/2023                PLT                      276                 08/13/2023              Anesthesia Other Findings   Reproductive/Obstetrics Endometriosis                               Anesthesia Physical Anesthesia Plan  ASA: 2  Anesthesia Plan: General   Post-op Pain Management: Tylenol  PO (pre-op)*   Induction: Intravenous  PONV Risk Score and Plan: 3 and Ondansetron , Dexamethasone  and Treatment may vary due to age or medical condition  Airway Management Planned: Oral  ETT  Additional Equipment:   Intra-op Plan:  Post-operative Plan: Extubation in OR  Informed Consent: I have reviewed the patients History and Physical, chart, labs and discussed the procedure including the risks, benefits and alternatives for the proposed anesthesia with the patient or authorized representative who has indicated his/her understanding and acceptance.     Dental advisory given  Plan Discussed with: CRNA and Anesthesiologist  Anesthesia Plan Comments: (PAT note written 08/14/2023 by Allison Zelenak, PA-C.  Risks of general anesthesia discussed including, but not limited to, sore throat, hoarse voice, chipped/damaged teeth, injury to vocal cords, nausea and vomiting, allergic reactions, lung infection, heart attack, stroke, and death. All questions answered.   )        Anesthesia Quick Evaluation

## 2023-08-22 NOTE — Progress Notes (Signed)
 Patient updated on new time of arrival for surgery on 08/23/2023. Patient to arrive at 1215.

## 2023-08-23 ENCOUNTER — Ambulatory Visit (HOSPITAL_BASED_OUTPATIENT_CLINIC_OR_DEPARTMENT_OTHER): Admitting: Anesthesiology

## 2023-08-23 ENCOUNTER — Ambulatory Visit (HOSPITAL_COMMUNITY)
Admission: RE | Admit: 2023-08-23 | Discharge: 2023-08-23 | Disposition: A | Discharge status: Home / Self Care | Attending: Orthopedic Surgery | Admitting: Orthopedic Surgery

## 2023-08-23 ENCOUNTER — Other Ambulatory Visit (HOSPITAL_COMMUNITY): Payer: Self-pay

## 2023-08-23 ENCOUNTER — Ambulatory Visit (HOSPITAL_COMMUNITY)

## 2023-08-23 ENCOUNTER — Encounter (HOSPITAL_COMMUNITY)
Admission: RE | Disposition: A | Discharge status: Home / Self Care | Payer: Self-pay | Source: Home / Self Care | Attending: Orthopedic Surgery

## 2023-08-23 ENCOUNTER — Ambulatory Visit (HOSPITAL_COMMUNITY): Payer: Self-pay | Admitting: Vascular Surgery

## 2023-08-23 ENCOUNTER — Encounter (HOSPITAL_COMMUNITY): Payer: Self-pay | Admitting: Orthopedic Surgery

## 2023-08-23 ENCOUNTER — Other Ambulatory Visit: Payer: Self-pay

## 2023-08-23 DIAGNOSIS — X58XXXA Exposure to other specified factors, initial encounter: Secondary | ICD-10-CM | POA: Diagnosis not present

## 2023-08-23 DIAGNOSIS — M25852 Other specified joint disorders, left hip: Secondary | ICD-10-CM | POA: Diagnosis not present

## 2023-08-23 DIAGNOSIS — S73102A Unspecified sprain of left hip, initial encounter: Secondary | ICD-10-CM | POA: Insufficient documentation

## 2023-08-23 DIAGNOSIS — S73192A Other sprain of left hip, initial encounter: Secondary | ICD-10-CM | POA: Diagnosis not present

## 2023-08-23 HISTORY — PX: HIP ARTHROSCOPY: SHX668

## 2023-08-23 SURGERY — ARTHROSCOPY HIP
Anesthesia: General | Site: Hip | Laterality: Left

## 2023-08-23 MED ORDER — ROCURONIUM BROMIDE 10 MG/ML (PF) SYRINGE
PREFILLED_SYRINGE | INTRAVENOUS | Status: DC | PRN
  Administered 2023-08-23: 10 mg via INTRAVENOUS
  Administered 2023-08-23: 5 mg via INTRAVENOUS
  Administered 2023-08-23: 50 mg via INTRAVENOUS
  Administered 2023-08-23 (×2): 5 mg via INTRAVENOUS

## 2023-08-23 MED ORDER — ONDANSETRON HCL 4 MG/2ML IJ SOLN
INTRAMUSCULAR | Status: DC | PRN
  Administered 2023-08-23: 4 mg via INTRAVENOUS

## 2023-08-23 MED ORDER — METHOCARBAMOL 750 MG PO TABS
750.0000 mg | ORAL_TABLET | Freq: Four times a day (QID) | ORAL | 1 refills | Status: DC | PRN
  Filled 2023-08-23: qty 60, 15d supply, fill #0

## 2023-08-23 MED ORDER — NAPROXEN 500 MG PO TABS
500.0000 mg | ORAL_TABLET | Freq: Two times a day (BID) | ORAL | 0 refills | Status: AC
  Filled 2023-08-23: qty 28, 14d supply, fill #0

## 2023-08-23 MED ORDER — GABAPENTIN 300 MG PO CAPS
ORAL_CAPSULE | ORAL | Status: AC
  Administered 2023-08-23: 300 mg via ORAL
  Filled 2023-08-23: qty 1

## 2023-08-23 MED ORDER — FENTANYL CITRATE (PF) 100 MCG/2ML IJ SOLN
25.0000 ug | INTRAMUSCULAR | Status: DC | PRN
  Administered 2023-08-23 (×3): 50 ug via INTRAVENOUS

## 2023-08-23 MED ORDER — APIXABAN 2.5 MG PO TABS
2.5000 mg | ORAL_TABLET | Freq: Two times a day (BID) | ORAL | 0 refills | Status: DC
  Filled 2023-08-23: qty 60, 30d supply, fill #0

## 2023-08-23 MED ORDER — DEXAMETHASONE SODIUM PHOSPHATE 10 MG/ML IJ SOLN
INTRAMUSCULAR | Status: AC
  Filled 2023-08-23: qty 1

## 2023-08-23 MED ORDER — FENTANYL CITRATE (PF) 100 MCG/2ML IJ SOLN
INTRAMUSCULAR | Status: AC
  Filled 2023-08-23: qty 2

## 2023-08-23 MED ORDER — OXYCODONE HCL 5 MG PO TABS
5.0000 mg | ORAL_TABLET | Freq: Once | ORAL | Status: AC | PRN
  Administered 2023-08-23: 5 mg via ORAL

## 2023-08-23 MED ORDER — BUPIVACAINE-EPINEPHRINE (PF) 0.25% -1:200000 IJ SOLN
INTRAMUSCULAR | Status: AC
  Filled 2023-08-23: qty 30

## 2023-08-23 MED ORDER — SCOPOLAMINE 1 MG/3DAYS TD PT72
MEDICATED_PATCH | TRANSDERMAL | Status: AC
  Administered 2023-08-23: 1.5 mg via TRANSDERMAL
  Filled 2023-08-23: qty 1

## 2023-08-23 MED ORDER — CHLORHEXIDINE GLUCONATE 0.12 % MT SOLN
OROMUCOSAL | Status: AC
  Administered 2023-08-23: 15 mL via OROMUCOSAL
  Filled 2023-08-23: qty 15

## 2023-08-23 MED ORDER — MIDAZOLAM HCL 2 MG/2ML IJ SOLN
INTRAMUSCULAR | Status: DC | PRN
Start: 2023-08-23 — End: 2023-08-23
  Administered 2023-08-23: 2 mg via INTRAVENOUS

## 2023-08-23 MED ORDER — SODIUM CHLORIDE 0.9 % IR SOLN
Status: DC | PRN
  Administered 2023-08-23 (×4): 3000 mL

## 2023-08-23 MED ORDER — PHENYLEPHRINE 80 MCG/ML (10ML) SYRINGE FOR IV PUSH (FOR BLOOD PRESSURE SUPPORT)
PREFILLED_SYRINGE | INTRAVENOUS | Status: DC | PRN
  Administered 2023-08-23 (×2): 120 ug via INTRAVENOUS
  Administered 2023-08-23: 80 ug via INTRAVENOUS

## 2023-08-23 MED ORDER — ROCURONIUM BROMIDE 10 MG/ML (PF) SYRINGE
PREFILLED_SYRINGE | INTRAVENOUS | Status: AC
  Filled 2023-08-23: qty 10

## 2023-08-23 MED ORDER — EPHEDRINE SULFATE-NACL 50-0.9 MG/10ML-% IV SOSY
PREFILLED_SYRINGE | INTRAVENOUS | Status: DC | PRN
  Administered 2023-08-23: 7.5 mg via INTRAVENOUS
  Administered 2023-08-23: 5 mg via INTRAVENOUS

## 2023-08-23 MED ORDER — DOXYCYCLINE HYCLATE 20 MG PO TABS
20.0000 mg | ORAL_TABLET | Freq: Every day | ORAL | 0 refills | Status: AC
  Filled 2023-08-23: qty 28, 28d supply, fill #0

## 2023-08-23 MED ORDER — FENTANYL CITRATE (PF) 250 MCG/5ML IJ SOLN
INTRAMUSCULAR | Status: AC
  Filled 2023-08-23: qty 5

## 2023-08-23 MED ORDER — OXYCODONE HCL 5 MG PO TABS
5.0000 mg | ORAL_TABLET | ORAL | 0 refills | Status: AC | PRN
Start: 2023-08-23 — End: 2023-08-30
  Filled 2023-08-23: qty 30, 5d supply, fill #0

## 2023-08-23 MED ORDER — PHENYLEPHRINE HCL-NACL 20-0.9 MG/250ML-% IV SOLN
INTRAVENOUS | Status: DC | PRN
Start: 2023-08-23 — End: 2023-08-23
  Administered 2023-08-23: 20 ug/min via INTRAVENOUS

## 2023-08-23 MED ORDER — CEFAZOLIN SODIUM-DEXTROSE 2-4 GM/100ML-% IV SOLN
INTRAVENOUS | Status: AC
  Filled 2023-08-23: qty 100

## 2023-08-23 MED ORDER — MIDAZOLAM HCL 2 MG/2ML IJ SOLN
INTRAMUSCULAR | Status: AC
  Filled 2023-08-23: qty 2

## 2023-08-23 MED ORDER — BUPIVACAINE-EPINEPHRINE 0.25% -1:200000 IJ SOLN
INTRAMUSCULAR | Status: DC | PRN
  Administered 2023-08-23: 20 mL

## 2023-08-23 MED ORDER — DEXAMETHASONE SODIUM PHOSPHATE 10 MG/ML IJ SOLN
INTRAMUSCULAR | Status: DC | PRN
Start: 2023-08-23 — End: 2023-08-23
  Administered 2023-08-23: 8 mg via INTRAVENOUS

## 2023-08-23 MED ORDER — LACTATED RINGERS IV SOLN: INTRAVENOUS | Status: DC

## 2023-08-23 MED ORDER — ORAL CARE MOUTH RINSE: 15.0000 mL | Freq: Once | OROMUCOSAL | Status: AC

## 2023-08-23 MED ORDER — CHLORHEXIDINE GLUCONATE 0.12 % MT SOLN: 15.0000 mL | Freq: Once | OROMUCOSAL | Status: AC

## 2023-08-23 MED ORDER — ONDANSETRON 4 MG PO TBDP
4.0000 mg | ORAL_TABLET | Freq: Three times a day (TID) | ORAL | 0 refills | Status: DC | PRN
  Filled 2023-08-23: qty 18, 6d supply, fill #0

## 2023-08-23 MED ORDER — FENTANYL CITRATE (PF) 250 MCG/5ML IJ SOLN
INTRAMUSCULAR | Status: DC | PRN
  Administered 2023-08-23: 50 ug via INTRAVENOUS
  Administered 2023-08-23: 100 ug via INTRAVENOUS

## 2023-08-23 MED ORDER — 0.9 % SODIUM CHLORIDE (POUR BTL) OPTIME
TOPICAL | Status: DC | PRN
  Administered 2023-08-23: 1000 mL

## 2023-08-23 MED ORDER — LIDOCAINE 2% (20 MG/ML) 5 ML SYRINGE
INTRAMUSCULAR | Status: DC | PRN
  Administered 2023-08-23: 60 mg via INTRAVENOUS

## 2023-08-23 MED ORDER — PROPOFOL 10 MG/ML IV BOLUS
INTRAVENOUS | Status: DC | PRN
Start: 2023-08-23 — End: 2023-08-23
  Administered 2023-08-23: 160 mg via INTRAVENOUS

## 2023-08-23 MED ORDER — GABAPENTIN 300 MG PO CAPS: 300.0000 mg | ORAL_CAPSULE | Freq: Once | ORAL | Status: AC

## 2023-08-23 MED ORDER — ACETAMINOPHEN 500 MG PO TABS: 1000.0000 mg | ORAL_TABLET | Freq: Once | ORAL | Status: AC

## 2023-08-23 MED ORDER — SCOPOLAMINE 1 MG/3DAYS TD PT72: 1.0000 | MEDICATED_PATCH | TRANSDERMAL | Status: DC

## 2023-08-23 MED ORDER — ONDANSETRON HCL 4 MG/2ML IJ SOLN
INTRAMUSCULAR | Status: AC
  Filled 2023-08-23: qty 2

## 2023-08-23 MED ORDER — LIDOCAINE 2% (20 MG/ML) 5 ML SYRINGE
INTRAMUSCULAR | Status: AC
  Filled 2023-08-23: qty 5

## 2023-08-23 MED ORDER — SUGAMMADEX SODIUM 200 MG/2ML IV SOLN
INTRAVENOUS | Status: DC | PRN
  Administered 2023-08-23: 180 mg via INTRAVENOUS

## 2023-08-23 MED ORDER — OXYCODONE HCL 5 MG PO TABS
ORAL_TABLET | ORAL | Status: AC
  Filled 2023-08-23: qty 1

## 2023-08-23 MED ORDER — CEFAZOLIN SODIUM-DEXTROSE 2-4 GM/100ML-% IV SOLN
2.0000 g | INTRAVENOUS | Status: AC
  Administered 2023-08-23: 2 g via INTRAVENOUS

## 2023-08-23 MED ORDER — OXYCODONE HCL 5 MG/5ML PO SOLN: 5.0000 mg | Freq: Once | ORAL | Status: AC | PRN

## 2023-08-23 MED ORDER — AMISULPRIDE (ANTIEMETIC) 5 MG/2ML IV SOLN: 10.0000 mg | Freq: Once | INTRAVENOUS | Status: DC | PRN

## 2023-08-23 MED ORDER — ACETAMINOPHEN 500 MG PO TABS
ORAL_TABLET | ORAL | Status: AC
  Administered 2023-08-23: 1000 mg via ORAL
  Filled 2023-08-23: qty 2

## 2023-08-23 SURGICAL SUPPLY — 45 items
ANCHOR SUT FIBERTAK 1.8 MT KL (Anchor) IMPLANT
BAG COUNTER SPONGE SURGICOUNT (BAG) IMPLANT
BLADE CAPSULECUT W/ HNDL (BLADE) IMPLANT
BLADE SURG 11 STRL SS (BLADE) ×1 IMPLANT
BUR ROUND HI FLUTE 8 4X19 (BURR) IMPLANT
BURR ROUND HI FLUTE 8 4X19 (BURR) ×1 IMPLANT
CANNULA PT SHLD/KNEE 8.25X110 (CANNULA) IMPLANT
CHLORAPREP W/TINT 26 (MISCELLANEOUS) ×1 IMPLANT
DISSECTOR 4.2MMX19CM HL (MISCELLANEOUS) IMPLANT
DRAPE C-ARM 42X72 X-RAY (DRAPES) ×1 IMPLANT
DRAPE STERI IOBAN 125X83 (DRAPES) ×1 IMPLANT
DRAPE U-SHAPE 47X51 STRL (DRAPES) ×2 IMPLANT
DRSG TEGADERM 4X4.75 (GAUZE/BANDAGES/DRESSINGS) ×2 IMPLANT
GAUZE SPONGE 4X4 12PLY STRL (GAUZE/BANDAGES/DRESSINGS) ×1 IMPLANT
GLOVE BIO SURGEON STRL SZ7.5 (GLOVE) ×2 IMPLANT
GLOVE BIOGEL PI IND STRL 8 (GLOVE) ×2 IMPLANT
GOWN STRL REUS W/ TWL LRG LVL3 (GOWN DISPOSABLE) ×1 IMPLANT
GOWN STRL REUS W/ TWL XL LVL3 (GOWN DISPOSABLE) ×1 IMPLANT
KIT ANCHOR SUT FBRTK CVD (ORTHOPEDIC DISPOSABLE SUPPLIES) IMPLANT
KIT BASIN OR (CUSTOM PROCEDURE TRAY) ×1 IMPLANT
KIT CANN FLUSHFIT XL DISP (CANNULA) IMPLANT
KIT HIP ARTHROSCOPY (ORTHOPEDIC DISPOSABLE SUPPLIES) ×1 IMPLANT
KIT HIP CRVD W/1.8 BIT DISP (ORTHOPEDIC DISPOSABLE SUPPLIES) IMPLANT
KIT TURNOVER KIT B (KITS) ×1 IMPLANT
MANIFOLD NEPTUNE II (INSTRUMENTS) ×2 IMPLANT
MARKER SKIN DUAL TIP RULER LAB (MISCELLANEOUS) ×1 IMPLANT
NDL HYPO 22X1.5 SAFETY MO (MISCELLANEOUS) IMPLANT
NEEDLE HYPO 22X1.5 SAFETY MO (MISCELLANEOUS) ×1 IMPLANT
PACK SRG BSC III STRL LF ECLPS (CUSTOM PROCEDURE TRAY) ×1 IMPLANT
PAD ARMBOARD POSITIONER FOAM (MISCELLANEOUS) ×2 IMPLANT
PASSER SUT SWIFTSTITCH HIP CRT (INSTRUMENTS) IMPLANT
SPIKE FLUID TRANSFER (MISCELLANEOUS) ×1 IMPLANT
SPONGE T-LAP 18X18 ~~LOC~~+RFID (SPONGE) ×1 IMPLANT
STRIP CLOSURE SKIN 1/2X4 (GAUZE/BANDAGES/DRESSINGS) IMPLANT
SUT ETHILON 4 0 PS 2 18 (SUTURE) ×1 IMPLANT
SUT FIBERWIRE #2 38 T-5 BLUE (SUTURE) IMPLANT
SUT MNCRL AB 3-0 PS2 27 (SUTURE) IMPLANT
SUTURE FIBERWR #2 38 T-5 BLUE (SUTURE) IMPLANT
SYR 50ML LL SCALE MARK (SYRINGE) ×1 IMPLANT
TAPE CLOTH 4X10 WHT NS (GAUZE/BANDAGES/DRESSINGS) ×1 IMPLANT
TOWEL GREEN STERILE (TOWEL DISPOSABLE) ×1 IMPLANT
TUBE CONNECTING 12X1/4 (SUCTIONS) ×2 IMPLANT
TUBING ARTHROSCOPY IRRIG 16FT (MISCELLANEOUS) ×1 IMPLANT
WAND APOLLO RF 50D ABLATOR (BUR) IMPLANT
WATER STERILE IRR 1000ML POUR (IV SOLUTION) ×1 IMPLANT

## 2023-08-23 NOTE — Anesthesia Procedure Notes (Signed)
 Procedure Name: Intubation Date/Time: 08/23/2023 2:18 PM  Performed by: Candance Certain, CRNAPre-anesthesia Checklist: Patient identified, Emergency Drugs available, Suction available and Patient being monitored Patient Re-evaluated:Patient Re-evaluated prior to induction Oxygen Delivery Method: Circle System Utilized Preoxygenation: Pre-oxygenation with 100% oxygen Induction Type: IV induction Ventilation: Mask ventilation without difficulty Laryngoscope Size: Mac and 3 Grade View: Grade I Tube type: Oral Tube size: 7.0 mm Number of attempts: 1 Airway Equipment and Method: Stylet Placement Confirmation: ETT inserted through vocal cords under direct vision, positive ETCO2 and breath sounds checked- equal and bilateral Secured at: 20 cm Tube secured with: Tape Dental Injury: Teeth and Oropharynx as per pre-operative assessment  Comments: Atraumatic induction/intubation. Dentition and oral mucosa undamaged and as per preop.

## 2023-08-23 NOTE — Op Note (Signed)
 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  She has had exhaustive conservative management to include steroid injection as well as physical therapy and oral medications.  She is here today for arthroscopic surgery.  We have previously discussed the risk, benefits, and indications of this procedure including but not limited to bleeding, infection, damage to surrounding neurovascular structures, heterotropic ossification, the risk of bleeding, the risk of stiffness, and need for further surgery as well as the risk of anesthesia. presents today for hip arthroscopy with possible loose body resection as well as femoroplasty to address his underlying hip pathology.;  The patient did consent to the procedure after discussion of the risks and benefits.   PREOPERATIVE DIAGNOSIS:  1. Left hip femoral acetabular impingement 2.  Left hip labrum tear   POSTOPERATIVE DIAGNOSIS:  1.  Left hip femoral acetabular impingement 2.  Left hip anterior superior labral tearing   PROCEDURE:  1.  Left hip arthroscopic labral repair   SURGEON: Selinda SHAUNNA Gosling, M.D.   ASSIST: Dayle Moores, PA-C  Assistant attestation:  PA McClung present and scrubbed for the entire procedure.   ANESTHESIA:  general with 20 cc of quarter percent plain Marcaine  infiltrated into the surgical field.   IV FLUIDS AND URINE: See anesthesia.   ESTIMATED BLOOD LOSS: 10 mL.   IMPLANTS:  Arthrex 1.8 mm Fibertak all suture anchor x 2   DRAINS: None   COMPLICATIONS: None.   DESCRIPTION OF PROCEDURE:  The patient was on a hand fracture table.  A well-padded oversized peroneal post was placed with well-padded traction boots applied to both right and left leg.  SCD was on the contralateral limb.  Timeout was performed by myself, the surgeon, to confirm correct site of surgery and confirm the procedure to be performed, and confirm IV antibiotics had been given.   Provisional traction  was applied to the hip placing adequate distraction to into the joint.  The left hip was then prepped and draped in the usual standard sterile fashion.  Femoral traction was reapplied to the hip placing it in the position of slight flexion with neutral rotation and neutral abduction.    Next, standard standard anterior peritrochanteric portal was established just superior and anterior to the superior tip of the greater trochanter.  Needle localization with the aid of image intensification and air contrast arthrogram confirmed intra-articular placement.  A Nitinol wire was then placed, 11 blade used to make a stab incision and then a 4.5 mm cannula was inserted atraumatically.  Next a mid anterior portal was established via direct visualization in the anterior triangle.  It was established approximately 7 cm distant at about a 45 angle off of the axial plane from the previously established anterior peritrochanteric portal.  Needle was inserted under direct visualization.  A Nitinol wire was then placed an 11 blade used to make a stab incision and a 5.0 cannula was inserted atraumatically.     Operative findings: On the diagnostic arthroscopy of this left hip there was there was no cartilage delamination.  There were no loose bodies. Peripherally there was moderate synovitis.  Femoral head Cartage was normal.  The acetabular Cartledge was normal.  Colloid fossa, ligament teres, and fovea capitis were normal.  There was a small anterior superior labral tear at the 11:00 to 1 o'clock position in this left hip.     At this time, we did an intraportal capsulotomy with a Beaver blade.  This was utilized to enlarge the capsular incision to allow for larger cannula insertion into work lateral and medial.  Motorized shaver was used to perform a gentle synovectomy.  The labrum was then debrided with the motorized shaver and ArthroCare wand.    Next we then prepared the torn labrum for its repair.  Utilizing the  motorized bur the acetabular rim was gently decorticated to allow for good bleeding bone.  This acetabuloplasty resected approximately 4 mm of bone.  Once this was accomplished we then maneuvered the drill guide into place at the 11 o'clock position.  We drilled the pilot hole for the 1.8 mm fiber tach.  The anchor was then placed into the pilot hole.  Care was taken not to penetrate the articular cartilage.  We then placed the anchor which had good bony fixation.  We then passed a simple repair stitch around the labrum with the soft tissue penetrator.  This was then passed through the self locking mechanism of the anchor and showed excellent tension and a nice rolled repair of the labrum.  This was then repeated at the 12 o'clock position.  A total of 2 anchors were used.    At this juncture we were satisfied that the intra-articular portion of the hip arthroscopy was complete.  We then removed gross traction placed a traction stitch in the inferior leaflet of the capsulotomy.  This allowed improved peripheral inspection of the junction of the anterior lateral and medial head neck junction.    No obvious cam lesion was noted.    Next, a scopic cannulas were removed.  Skin incision was closed with 3-0 Monocryl, and Steri-Strips.  Wounds were injected with quarter percent plain Marcaine  with epinephrine .  The wounds were then dressed with Steri-Strips, Adaptic, 4 x 4's, and a Tegaderm.   Postop plan:   Touchdown weightbearing 3 weeks.  She will begin physical therapy next week.  She will be on the standard hip arthroscopy postoperative medication protocol to prevent heterotropic ossification and gastric ulcer.  I will see her in the office in 2 weeks.   Given her history of PE we will also keep her on Eliquis  2.5 mg twice daily x 6 weeks.

## 2023-08-23 NOTE — Brief Op Note (Signed)
 08/23/2023  3:45 PM  PATIENT:  Duey Ghent  38 y.o. female  PRE-OPERATIVE DIAGNOSIS:  left hip labrum tear  POST-OPERATIVE DIAGNOSIS:  left hip labrum tear  PROCEDURE:  Procedure(s): ARTHROSCOPY HIP WITH LABRUM REPAIR (Left)  SURGEON:  Surgeons and Role:    * Janeth Medicus, MD - Primary  PHYSICIAN ASSISTANT: Karyl Paget, PA-C  ANESTHESIA:   local and general  EBL:  15 cc  BLOOD ADMINISTERED:none  DRAINS: none   LOCAL MEDICATIONS USED:  MARCAINE      SPECIMEN:  No Specimen  DISPOSITION OF SPECIMEN:  N/A  COUNTS:  YES  TOURNIQUET:  * No tourniquets in log *  DICTATION: .Note written in EPIC  PLAN OF CARE: Discharge to home after PACU  PATIENT DISPOSITION:  PACU - hemodynamically stable.   Delay start of Pharmacological VTE agent (>24hrs) due to surgical blood loss or risk of bleeding: not applicable

## 2023-08-23 NOTE — Anesthesia Postprocedure Evaluation (Signed)
 Anesthesia Post Note  Patient: Stephanie Hicks  Procedure(s) Performed: ARTHROSCOPY HIP WITH LABRUM REPAIR (Left: Hip)     Patient location during evaluation: PACU Anesthesia Type: General Level of consciousness: awake and alert Pain management: pain level controlled Vital Signs Assessment: post-procedure vital signs reviewed and stable Respiratory status: spontaneous breathing, nonlabored ventilation and respiratory function stable Cardiovascular status: stable and blood pressure returned to baseline Anesthetic complications: no   No notable events documented.  Last Vitals:  Vitals:   08/23/23 1645 08/23/23 1700  BP: 114/68 110/79  Pulse: 98 61  Resp: 16 13  Temp:    SpO2: 98% 100%                  [name]

## 2023-08-23 NOTE — Transfer of Care (Signed)
 Immediate Anesthesia Transfer of Care Note  Patient: Duey Ghent  Procedure(s) Performed: ARTHROSCOPY HIP WITH LABRUM REPAIR (Left: Hip)  Patient Location: PACU  Anesthesia Type:General  Level of Consciousness: drowsy  Airway & Oxygen Therapy: Patient Spontanous Breathing and Patient connected to face mask oxygen  Post-op Assessment: Report given to RN and Post -op Vital signs reviewed and stable  Post vital signs: Reviewed and stable  Last Vitals:  Vitals Value Taken Time  BP    Temp 36.5 C 08/23/23 1608  Pulse 99 08/23/23 1611  Resp 17 08/23/23 1611  SpO2 100 % 08/23/23 1611  Vitals shown include unfiled device data.  Last Pain:  Vitals:   08/23/23 1220  TempSrc:   PainSc: 4          Complications: No notable events documented.

## 2023-08-23 NOTE — Progress Notes (Signed)
 Orthopedic Tech Progress Note Patient Details:  Stephanie Hicks May 18, 1985 [id number]  Ortho Devices Type of Ortho Device: Crutches Ortho Device/Splint Location: Pt. was still lightheaded from anesthesia. Advised I can come back and demonstrat AGAIN how to use crutches Ortho Device/Splint Interventions: Ordered, Application, Adjustment   Post Interventions Patient Tolerated: Fair Instructions Provided: Adjustment of device, Care of device  Stephanie Hicks 08/23/2023, 6:03 PM

## 2023-08-23 NOTE — Discharge Instructions (Addendum)
 Orthopedic surgery discharge instructions:  -You will be touchdown weightbearing to the operative lower extremity for 3 weeks.   utilizing crutches for mobilization.  You may begin moving your hip in a rotational fashion as soon as possible.  -You should apply ice to the hip for 30 minutes out of each hour that you are awake.  -Maintain your postoperative bandage until follow-up appointment.  This is a waterproof bandage and you may shower with this on as soon as you feel able.  Do not submerge underwater.  -Over-the-counter medications you should take:   -Zantac 150 mg twice per day while taking the Naprosyn , which will be for 28 total days.   - For DVT and PE prophylaxis will take 2.5 mg of Eliquis  twice daily for 6 weeks.    -You should take Colace 100 mg tablet twice per day to prevent constipation.

## 2023-08-23 NOTE — H&P (Signed)
 ORTHOPAEDIC H and P  REQUESTING PHYSICIAN: Janeth Medicus, MD  PCP:  Beecher Bower, MD  Chief Complaint: Left hip labrum tear  HPI: Stephanie Hicks is a 38 y.o. female who complains of left hip pain over the last 2 to 3 years.  Today for arthroscopic labral repair.  No new complaints with her last office visit.  Past Medical History:  Diagnosis Date   Anxiety    Depression    Endometriosis    Headache    migraines   Pulmonary embolism (HCC) 2006   related to birth control pills   Past Surgical History:  Procedure Laterality Date   BREAST LUMPECTOMY Right 2021   CESAREAN SECTION N/A 12/01/2015   Procedure: CESAREAN SECTION;  Surgeon: Matt Song, MD;  Location: [facility] BIRTHING SUITES;  Service: Obstetrics;  Laterality: N/A;   CESAREAN SECTION WITH BILATERAL TUBAL LIGATION N/A 12/30/2018   Procedure: CESAREAN SECTION WITH BILATERAL TUBAL LIGATION;  Surgeon: Luan Rumpf, MD;  Location: [facility] LD ORS;  Service: Obstetrics;  Laterality: N/A;   COLPOSCOPY     DRUG INDUCED ENDOSCOPY     gyn laparoscopy     KNEE ARTHROSCOPY  2003   right knee   TONSILLECTOMY  2008   Social History   Socioeconomic History   Marital status: Married    Spouse name: Not on file   Number of children: Not on file   Years of education: Not on file   Highest education level: Not on file  Occupational History   Not on file  Tobacco Use   Smoking status: Never   Smokeless tobacco: Never  Vaping Use   Vaping status: Never Used  Substance and Sexual Activity   Alcohol use: Yes    Comment: rarely   Drug use: No   Sexual activity: Yes  Other Topics Concern   Not on file  Social History Narrative   Not on file   Social Drivers of Health   Financial Resource Strain: Low Risk  (12/19/2018)   Overall Financial Resource Strain (CARDIA)    Difficulty of Paying Living Expenses: Not hard at all  Food Insecurity: No Food Insecurity (12/19/2018)   Hunger Vital Sign    Worried About Running  Out of Food in the Last Year: Never true    Ran Out of Food in the Last Year: Never true  Transportation Needs: No Transportation Needs (12/19/2018)   PRAPARE - Administrator, Civil Service (Medical): No    Lack of Transportation (Non-Medical): No  Physical Activity: Not on file  Stress: Not on file  Social Connections: Not on file   Family History  Problem Relation Age of Onset   Hypertension Father    Cancer Maternal Grandfather    Diabetes Paternal Grandfather    Cancer Paternal Grandfather    Alzheimer's disease Paternal Grandmother    Allergies  Allergen Reactions   Adhesive [Tape]     Surgical tape - causes skin irritation    Clarithromycin Other (See Comments)    Severe migraine headaches   Dilaudid  [Hydromorphone ] Hives   Gluten Meal    Naproxen  Hives   Prior to Admission medications   Medication Sig Start Date End Date Taking? Authorizing Provider  aspirin-acetaminophen -caffeine (EXCEDRIN MIGRAINE) 250-250-65 MG tablet Take 2 tablets by mouth every 8 (eight) hours as needed for migraine or headache.   Yes [provider]  Cholecalciferol (VITAMIN D3 PO) Take 1 tablet by mouth in the morning.   Yes [provider]  MAGNESIUM  PO Take 1 capsule by mouth at bedtime as needed (sleep.).   Yes [provider]  meloxicam (MOBIC) 15 MG tablet Take 15 mg by mouth in the morning.   Yes [provider]  Multiple Vitamin (MULTIVITAMIN WITH MINERALS) TABS tablet Take 1 tablet by mouth in the morning.   Yes [provider]  Polyethyl Glycol-Propyl Glycol (SYSTANE) 0.4-0.3 % SOLN Place 1 drop into both eyes 3 (three) times daily as needed (dry/irritated eyes.).   Yes [provider]  tiZANidine (ZANAFLEX) 2 MG tablet Take 2-4 mg by mouth at bedtime as needed for muscle spasms.   Yes [provider]   No results found.  Positive ROS: All other systems have been reviewed and were otherwise negative with the  exception of those mentioned in the HPI and as above.  Physical Exam: General: Alert, no acute distress Cardiovascular: No pedal edema Respiratory: No cyanosis, no use of accessory musculature GI: No organomegaly, abdomen is soft and non-tender Skin: No lesions in the area of chief complaint Neurologic: Sensation intact distally Psychiatric: Patient is competent for consent with normal mood and affect Lymphatic: No axillary or cervical lymphadenopathy  MUSCULOSKELETAL: Left lower extremity is warm and well-perfused no open wounds or lesions.  Distally neurovascular intact.  Assessment: 1.  Left hip anterior superior labrum tear 2.  Left hip femoral acetabular impingement  Plan: Plan will be to proceed today with arthroscopic labral repair as well as acetabuloplasty.  We again discussed the risk and benefits of the procedure including but not limited to bleeding, infection, damage to surrounding nerves and vessels, heterotopic ossification, fracture, development of arthritis, dislocation as well as the risk of DVT and anesthesia.  She has provided informed consent.  Plan for discharge home postoperatively from PACU.    Janeth Medicus, MD Cell [phone]    08/23/2023 1:50 PM

## 2023-08-25 ENCOUNTER — Encounter (HOSPITAL_COMMUNITY): Payer: Self-pay

## 2023-08-26 ENCOUNTER — Encounter (HOSPITAL_COMMUNITY): Payer: Self-pay | Admitting: Orthopedic Surgery

## 2023-11-15 DIAGNOSIS — Z9889 Other specified postprocedural states: Secondary | ICD-10-CM | POA: Insufficient documentation

## 2024-02-14 ENCOUNTER — Other Ambulatory Visit: Payer: Self-pay | Admitting: *Deleted

## 2024-02-14 DIAGNOSIS — R200 Anesthesia of skin: Secondary | ICD-10-CM

## 2024-02-27 NOTE — Progress Notes (Unsigned)
 Patient ID: Annabella MALVA Gage, female   DOB: 01-16-1986, 38 y.o.   MRN: [id number]  Reason for Consult: No chief complaint on file.   Referred by Fernand Tracey LABOR, MD  Subjective:     HPI SHARIYAH ELAND is a 38 y.o. female ***  Past Medical History:  Diagnosis Date   Anxiety    Depression    Endometriosis    Headache    migraines   Pulmonary embolism (HCC) 2006   related to birth control pills   Family History  Problem Relation Age of Onset   Hypertension Father    Cancer Maternal Grandfather    Diabetes Paternal Grandfather    Cancer Paternal Grandfather    Alzheimer's disease Paternal Grandmother    Past Surgical History:  Procedure Laterality Date   BREAST LUMPECTOMY Right 2021   CESAREAN SECTION N/A 12/01/2015   Procedure: CESAREAN SECTION;  Surgeon: Rosaline Luna, MD;  Location: [facility] BIRTHING SUITES;  Service: Obstetrics;  Laterality: N/A;   CESAREAN SECTION WITH BILATERAL TUBAL LIGATION N/A 12/30/2018   Procedure: CESAREAN SECTION WITH BILATERAL TUBAL LIGATION;  Surgeon: Gretta Gums, MD;  Location: [facility] LD ORS;  Service: Obstetrics;  Laterality: N/A;   COLPOSCOPY     DRUG INDUCED ENDOSCOPY     gyn laparoscopy     HIP ARTHROSCOPY Left 08/23/2023   Procedure: ARTHROSCOPY HIP WITH LABRUM REPAIR;  Surgeon: Sharl Selinda Dover, MD;  Location: [facility] OR;  Service: Orthopedics;  Laterality: Left;   KNEE ARTHROSCOPY  2003   right knee   TONSILLECTOMY  2008    Short Social History:  Social History   Tobacco Use   Smoking status: Never   Smokeless tobacco: Never  Substance Use Topics   Alcohol use: Yes    Comment: rarely    Allergies  Allergen Reactions   Adhesive [Tape]     Surgical tape - causes skin irritation    Clarithromycin Other (See Comments)    Severe migraine headaches   Dilaudid  [Hydromorphone ] Hives   Gluten Meal    Naproxen  Hives    Current Outpatient Medications  Medication Sig Dispense Refill   apixaban  (ELIQUIS ) 2.5 MG TABS tablet Take  1 tablet (2.5 mg total) by mouth 2 (two) times daily. 84 tablet 0   aspirin-acetaminophen -caffeine (EXCEDRIN MIGRAINE) 250-250-65 MG tablet Take 2 tablets by mouth every 8 (eight) hours as needed for migraine or headache.     Cholecalciferol (VITAMIN D3 PO) Take 1 tablet by mouth in the morning.     MAGNESIUM  PO Take 1 capsule by mouth at bedtime as needed (sleep.).     methocarbamol  (ROBAXIN -750) 750 MG tablet Take 1 tablet (750 mg total) by mouth every 6 (six) hours as needed for muscle spasms. 60 tablet 1   Multiple Vitamin (MULTIVITAMIN WITH MINERALS) TABS tablet Take 1 tablet by mouth in the morning.     ondansetron  (ZOFRAN -ODT) 4 MG disintegrating tablet Take 1 tablet (4 mg total) by mouth every 8 (eight) hours as needed. 20 tablet 0   Polyethyl Glycol-Propyl Glycol (SYSTANE) 0.4-0.3 % SOLN Place 1 drop into both eyes 3 (three) times daily as needed (dry/irritated eyes.).     No current facility-administered medications for this visit.    REVIEW OF SYSTEMS  All other systems were reviewed and are negative     Objective:  Objective   There were no vitals filed for this visit. There is no height or weight on file to calculate BMI.  Physical Exam General: no acute  distress Cardiac: hemodynamically stable Pulm: normal work of breathing Abdomen: non-tender, no pulsatile mass*** Neuro: alert, no focal deficit Extremities: no edema, cyanosis or wounds*** Vascular:   Right: ***  Left: ***  Data: AAA ***     Assessment/Plan:   DARYAN CAGLEY is a 38 y.o. female who is undergoing workup for Ehlers-Danlos. ***   Norman GORMAN Serve MD [facility]

## 2024-02-28 ENCOUNTER — Ambulatory Visit (INDEPENDENT_AMBULATORY_CARE_PROVIDER_SITE_OTHER): Admitting: Vascular Surgery

## 2024-02-28 ENCOUNTER — Encounter: Payer: Self-pay | Admitting: Vascular Surgery

## 2024-02-28 ENCOUNTER — Ambulatory Visit (HOSPITAL_COMMUNITY)
Admission: RE | Admit: 2024-02-28 | Discharge: 2024-02-28 | Disposition: A | Discharge status: Home / Self Care | Source: Ambulatory Visit | Attending: Vascular Surgery | Admitting: Vascular Surgery

## 2024-02-28 VITALS — BP 144/80 | HR 80 | Temp 98.7°F | Ht 66.0 in | Wt 185.0 lb

## 2024-02-28 DIAGNOSIS — R202 Paresthesia of skin: Secondary | ICD-10-CM | POA: Diagnosis present

## 2024-02-28 DIAGNOSIS — R200 Anesthesia of skin: Secondary | ICD-10-CM | POA: Diagnosis present

## 2024-02-28 DIAGNOSIS — Q798 Other congenital malformations of musculoskeletal system: Secondary | ICD-10-CM

## 2024-02-28 LAB — VAS US ABI WITH/WO TBI
Left ABI: 1.11
Right ABI: 1.24

## 2024-02-28 NOTE — Addendum Note (Signed)
 Addended by: PEARLINE GEE on: 02/28/2024 09:54 AM   Modules accepted: Orders

## 2024-03-02 ENCOUNTER — Other Ambulatory Visit: Payer: Self-pay

## 2024-03-02 DIAGNOSIS — Q798 Other congenital malformations of musculoskeletal system: Secondary | ICD-10-CM

## 2024-04-10 NOTE — Progress Notes (Signed)
   I, Leotis Batter, CMA acting as a scribe for Artist Lloyd, MD.  Stephanie Hicks is a 38 y.o. female who presents to [facility] at [facility] today for evaluation of her hypermobility. Hx of L hip labral repair April 2025.  MS: Chronic joint pain, Chronic wide spread muscle pain, Joint Hypermobility, Joint Instability, Joint Subluxation, and Slipping Ribs / Costochondritis  Skin/Immune reactions: Hyperextensible/ stretchy Skin, Soft Velvety Skin, Poor Wound Healing, Easy Bruising / Bleeding, Atrophic Scars, Rashes / Hives, and Medication / Chemical / Food Sensitivities  Neurological: Headache  Migraine, Syncope / Pre-Syncope, Reduced Proprioception, Clumsiness , Burning, Numbness and/or Tingling in Extremities, Cognitive Dysfunction / Brain Fog, Sleep Disturbance, and Sensory Sensitivities ANS: Syncope / Pre-Syncope / Dizziness, Fatigue, and Anxiety / Panic Respiratory: Dyspnea and Chest Tightness CV: Tachycardia and Varicose Veins GI:  GERD, IBS with Diarrhea or Constipation, Painful ABD Bloating, and Frequent N/V Genitourinary: Pelvic Pain / Fullness / Pressure, Incontinence, Painful Intercourse, and Endometriosis Hands & Feet: Long Slender Fingers / Toes and Piezogenic Papules  Treatments tried:  Have you done formal Physical Therapy? If so, when, where and for how long? Have you or do you participate in a home exercise program?   Pertinent review of systems: No fevers or chills  Relevant historical information: History of pulmonary embolism and placental abruption Some concern for vascular EDS or Marfan's.  Exam:  BP 112/80   Pulse 86   Ht 5' 6 (1.676 m)   Wt 187 lb (84.8 kg)   LMP 07/16/2017   SpO2 97%   BMI 30.18 kg/m  General: Well Developed, well nourished, and in no acute distress.   MSK: Hypermobility evaluation is positive Beighton score 7/9.  EDS evaluation is positive with a score of 6    Lab and Radiology Results No results found for  this or any previous visit (from the past 72 hours). No results found.     Assessment and Plan: 38 y.o. female with Ehlers-Danlos syndrome.  Subtype is a bit uncertain as of this visit.  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  Will proceed with genetic testing.  Will use the [facility] connective tissue disorder panel which test for 92 different genes including subtypes of EDS including vascular EDS as well as Marfan's and many others.  We talked a fair amount about self-care guidance exercise etc.  She does have a good physical therapist she likes at [facility] in [place] where she lives. (She works at the [employer]).   Plan to recheck in about 2 months.   PDMP not reviewed this encounter. No orders of the defined types were placed in this encounter.  No orders of the defined types were placed in this encounter.    Discussed warning signs or symptoms. Please see discharge instructions. Patient expresses understanding.   The above documentation has been reviewed and is accurate and complete Artist Lloyd, M.D.  Total encounter time 30 minutes including face-to-face time with the patient and, reviewing past medical record, and charting on the date of service.

## 2024-04-13 ENCOUNTER — Ambulatory Visit: Admitting: Family Medicine

## 2024-04-13 ENCOUNTER — Telehealth: Payer: Self-pay

## 2024-04-13 ENCOUNTER — Encounter: Payer: Self-pay | Admitting: Family Medicine

## 2024-04-13 VITALS — BP 112/80 | HR 86 | Ht 66.0 in | Wt 187.0 lb

## 2024-04-13 DIAGNOSIS — Z9889 Other specified postprocedural states: Secondary | ICD-10-CM

## 2024-04-13 DIAGNOSIS — Q796 Ehlers-Danlos syndrome, unspecified: Secondary | ICD-10-CM | POA: Insufficient documentation

## 2024-04-13 NOTE — Telephone Encounter (Signed)
 Check insurance requirements for [facility] connective tissue panel, concerns for vascular EDS.

## 2024-04-13 NOTE — Patient Instructions (Addendum)
 Thank you for coming in today.   [facility] Connective Tissue Disorders Panel Test code: [phone]  92 genes  Check out the book Disjointed Navigating the Diagnosis and Management of Hypermobile Ehlers-Danlos Syndrome and Hypermobility Spectrum Disorders.    For POTS symptoms: Consume 3 L water  and 8-12 gram of salt per day   Check out these websites for exercises to try if you have POTS ([employer] Protocol, [place] Protocol, and [employer] Protocol) Https://www.taylor-robbins.[email or web address]/ https://dean.info/   Guide to Prof. Russek's Patient Self-Care Handouts https://webspace.Http://www.black-smith.org/  Orthostatic Intolerance and Postural Orthostatic Tachycardia Self-Care Checklist https://webspace.Wvlyxc.com  Steps To Managing Your Mast Cell Activation Syndrome https://webspace.Newyearsms.fi.pdf  See you back in 2 months

## 2024-04-15 ENCOUNTER — Ambulatory Visit (HOSPITAL_COMMUNITY)
Admission: RE | Admit: 2024-04-15 | Discharge: 2024-04-15 | Disposition: A | Discharge status: Home / Self Care | Source: Ambulatory Visit | Attending: Cardiovascular Disease | Admitting: Cardiovascular Disease

## 2024-04-15 DIAGNOSIS — Q798 Other congenital malformations of musculoskeletal system: Secondary | ICD-10-CM | POA: Insufficient documentation

## 2024-04-15 LAB — ECHOCARDIOGRAM LIMITED
Area-P 1/2: 3.33 cm2
S' Lateral: 3 cm

## 2024-04-16 NOTE — Telephone Encounter (Signed)
 Order placed for [facility] connective tissue panel via [employer].

## 2024-04-22 NOTE — Progress Notes (Unsigned)
 Patient ID: Annabella MALVA Gage, female   DOB: Jun 21, 1985, 38 y.o.   MRN: [id number]  Reason for Consult: No chief complaint on file.   Referred by Fernand Tracey LABOR, MD  Subjective:     HPI FANNY AGAN is a 38 y.o. female with Ehlers-Danlos who presented 3 months ago for screening.  She does not have any family history of aneurysm.  She recently did get testing and was told that she does have Ehlers-Danlos.  An echo was recently obtained which did not demonstrate any aortic root dilation.  Past Medical History:  Diagnosis Date   Anxiety    Depression    Endometriosis    Headache    migraines   Pulmonary embolism (HCC) 2006   related to birth control pills   Family History  Problem Relation Age of Onset   Hypertension Father    Cancer Maternal Grandfather    Diabetes Paternal Grandfather    Cancer Paternal Grandfather    Alzheimer's disease Paternal Grandmother    Past Surgical History:  Procedure Laterality Date   BREAST LUMPECTOMY Right 2021   CESAREAN SECTION N/A 12/01/2015   Procedure: CESAREAN SECTION;  Surgeon: Rosaline Luna, MD;  Location: [facility];  Service: Obstetrics;  Laterality: N/A;   CESAREAN SECTION WITH BILATERAL TUBAL LIGATION N/A 12/30/2018   Procedure: CESAREAN SECTION WITH BILATERAL TUBAL LIGATION;  Surgeon: Gretta Gums, MD;  Location: [facility] LD ORS;  Service: Obstetrics;  Laterality: N/A;   COLPOSCOPY     DRUG INDUCED ENDOSCOPY     gyn laparoscopy     HIP ARTHROSCOPY Left 08/23/2023   Procedure: ARTHROSCOPY HIP WITH LABRUM REPAIR;  Surgeon: Sharl Selinda Dover, MD;  Location: [facility] OR;  Service: Orthopedics;  Laterality: Left;   KNEE ARTHROSCOPY  2003   right knee   TONSILLECTOMY  2008    Short Social History:  Social History   Tobacco Use   Smoking status: Never   Smokeless tobacco: Never  Substance Use Topics   Alcohol use: Yes    Comment: rarely    Allergies[1]  Current Outpatient Medications  Medication Sig Dispense Refill    Cholecalciferol (VITAMIN D3 PO) Take 1 tablet by mouth in the morning.     Multiple Vitamin (MULTIVITAMIN WITH MINERALS) TABS tablet Take 1 tablet by mouth in the morning.     tiZANidine (ZANAFLEX) 2 MG tablet Take 2-4 mg by mouth at bedtime as needed.     No current facility-administered medications for this visit.    REVIEW OF SYSTEMS  All other systems were reviewed and are negative     Objective:  Objective   There were no vitals filed for this visit. There is no height or weight on file to calculate BMI.  Physical Exam General: no acute distress Cardiac: hemodynamically stable Abdomen: non-tender, no pulsatile mass*** Extremities: no edema, cyanosis or wounds*** Vascular:   Right: ***  Left: ***  Data: AAA duplex ***     Assessment/Plan:   BERNISHA VERMA is a 38 y.o. female with Ehlers-Danlos.  I explained that her AAA duplex was negative although we can continue surveillance every 2 years for the time being.  I also discussed that her echo was negative for any aortic root dilation but also explained that this test does not completely evaluate the ascending aorta.  She can discuss whether she would like a CT chest with her PCP. Will plan for follow-up in 2 years with a repeat AAA duplex  Recommendations to optimize  cardiovascular risk: Abstinence from all tobacco products. Blood glucose control with goal A1c < 7%. Blood pressure control with goal blood pressure < 140/90 mmHg. Lipid reduction therapy with goal LDL-C <55 mg/dL  Aspirin 81mg  PO QD.  Atorvastatin 40-80mg  PO QD (or other high intensity statin therapy).   Norman GORMAN Serve MD [facility]     [1]  Allergies Allergen Reactions   Adhesive [Tape]     Surgical tape - causes skin irritation    Clarithromycin Other (See Comments)    Severe migraine headaches   Dilaudid  [Hydromorphone ] Hives   Gluten Meal    Naproxen  Hives

## 2024-04-24 ENCOUNTER — Ambulatory Visit (HOSPITAL_COMMUNITY)
Admission: RE | Admit: 2024-04-24 | Discharge: 2024-04-24 | Disposition: A | Discharge status: Home / Self Care | Source: Ambulatory Visit | Attending: Surgery | Admitting: Surgery

## 2024-04-24 ENCOUNTER — Encounter: Payer: Self-pay | Admitting: Vascular Surgery

## 2024-04-24 ENCOUNTER — Ambulatory Visit: Admitting: Vascular Surgery

## 2024-04-24 VITALS — BP 116/77 | HR 95 | Temp 98.1°F | Resp 16 | Ht 66.0 in | Wt 183.9 lb

## 2024-04-24 DIAGNOSIS — Q798 Other congenital malformations of musculoskeletal system: Secondary | ICD-10-CM

## 2024-06-10 NOTE — Progress Notes (Unsigned)
"       ° °  LILLETTE Ileana Collet, PhD, LAT, ATC acting as a scribe for Artist Lloyd, MD.  Stephanie Hicks is a 39 y.o. female who presents to [facility] today for 28-month f/u EDS. Pt was last seen by Dr. Lloyd on 04/13/24 and was [facility] genetic testing was ordered and advised to cont PT.  Today, pt reports ***  Dx testing: 04/24/24 Vasc US  AAA duplex  04/15/24 Echo  Pertinent review of systems: ***  Relevant historical information: ***   Exam:  LMP 07/16/2017  General: Well Developed, well nourished, and in no acute distress.   MSK: ***    Lab and Radiology Results No results found for this or any previous visit (from the past 72 hours). No results found.     Assessment and Plan: 39 y.o. female with ***   PDMP not reviewed this encounter. No orders of the defined types were placed in this encounter.  No orders of the defined types were placed in this encounter.    Discussed warning signs or symptoms. Please see discharge instructions. Patient expresses understanding.   ***  "

## 2024-06-15 ENCOUNTER — Ambulatory Visit: Admitting: Family Medicine
